# Patient Record
Sex: Female | Born: 1980 | Race: White | Hispanic: No | Smoking: Current every day smoker
Health system: Southern US, Community
[De-identification: ages and names within clinical notes are randomized; demographics above are authoritative.]

## PROBLEM LIST (undated history)

## (undated) DIAGNOSIS — E109 Type 1 diabetes mellitus without complications: Secondary | ICD-10-CM

## (undated) DIAGNOSIS — R112 Nausea with vomiting, unspecified: Secondary | ICD-10-CM

## (undated) DIAGNOSIS — F1910 Other psychoactive substance abuse, uncomplicated: Secondary | ICD-10-CM

## (undated) DIAGNOSIS — R569 Unspecified convulsions: Secondary | ICD-10-CM

## (undated) DIAGNOSIS — J45909 Unspecified asthma, uncomplicated: Secondary | ICD-10-CM

## (undated) DIAGNOSIS — G8929 Other chronic pain: Secondary | ICD-10-CM

## (undated) DIAGNOSIS — K589 Irritable bowel syndrome without diarrhea: Secondary | ICD-10-CM

## (undated) DIAGNOSIS — F319 Bipolar disorder, unspecified: Secondary | ICD-10-CM

## (undated) DIAGNOSIS — R109 Unspecified abdominal pain: Secondary | ICD-10-CM

## (undated) DIAGNOSIS — K219 Gastro-esophageal reflux disease without esophagitis: Secondary | ICD-10-CM

## (undated) HISTORY — PX: KNEE SURGERY: SHX244

## (undated) HISTORY — DX: Type 1 diabetes mellitus without complications: E10.9

## (undated) HISTORY — PX: TUBAL LIGATION: SHX77

## (undated) HISTORY — PX: WRIST SURGERY: SHX841

## (undated) HISTORY — PX: APPENDECTOMY: SHX54

---

## 2004-05-03 ENCOUNTER — Encounter: Admission: RE | Admit: 2004-05-03 | Discharge: 2004-05-03 | Payer: Self-pay | Admitting: Internal Medicine

## 2012-08-04 ENCOUNTER — Other Ambulatory Visit (HOSPITAL_COMMUNITY): Payer: Self-pay | Admitting: Internal Medicine

## 2012-08-04 DIAGNOSIS — R109 Unspecified abdominal pain: Secondary | ICD-10-CM

## 2012-08-06 ENCOUNTER — Ambulatory Visit (HOSPITAL_COMMUNITY): Payer: Self-pay

## 2012-08-07 ENCOUNTER — Ambulatory Visit (HOSPITAL_COMMUNITY)
Admission: RE | Admit: 2012-08-07 | Discharge: 2012-08-07 | Disposition: A | Discharge status: Home / Self Care | Payer: Self-pay | Source: Ambulatory Visit | Attending: Internal Medicine | Admitting: Internal Medicine

## 2012-08-07 DIAGNOSIS — R1031 Right lower quadrant pain: Secondary | ICD-10-CM | POA: Insufficient documentation

## 2012-08-07 DIAGNOSIS — K829 Disease of gallbladder, unspecified: Secondary | ICD-10-CM | POA: Insufficient documentation

## 2012-08-07 DIAGNOSIS — R9389 Abnormal findings on diagnostic imaging of other specified body structures: Secondary | ICD-10-CM | POA: Insufficient documentation

## 2012-08-07 DIAGNOSIS — R933 Abnormal findings on diagnostic imaging of other parts of digestive tract: Secondary | ICD-10-CM | POA: Insufficient documentation

## 2012-08-07 DIAGNOSIS — R109 Unspecified abdominal pain: Secondary | ICD-10-CM

## 2012-08-07 DIAGNOSIS — R634 Abnormal weight loss: Secondary | ICD-10-CM | POA: Insufficient documentation

## 2012-08-07 DIAGNOSIS — R197 Diarrhea, unspecified: Secondary | ICD-10-CM | POA: Insufficient documentation

## 2012-08-07 MED ORDER — IOHEXOL 300 MG/ML  SOLN: 100.0000 mL | Freq: Once | INTRAMUSCULAR | Status: AC | PRN

## 2012-08-17 ENCOUNTER — Encounter: Payer: Self-pay | Admitting: Gastroenterology

## 2012-08-17 ENCOUNTER — Ambulatory Visit (INDEPENDENT_AMBULATORY_CARE_PROVIDER_SITE_OTHER): Payer: Self-pay | Admitting: Gastroenterology

## 2012-08-17 VITALS — BP 111/72 | HR 103 | Temp 97.2°F | Ht 64.0 in | Wt 93.8 lb

## 2012-08-17 DIAGNOSIS — R634 Abnormal weight loss: Secondary | ICD-10-CM

## 2012-08-17 DIAGNOSIS — R112 Nausea with vomiting, unspecified: Secondary | ICD-10-CM

## 2012-08-17 DIAGNOSIS — K921 Melena: Secondary | ICD-10-CM

## 2012-08-17 DIAGNOSIS — R109 Unspecified abdominal pain: Secondary | ICD-10-CM

## 2012-08-17 MED ORDER — OMEPRAZOLE 20 MG PO CPDR: 20.0000 mg | DELAYED_RELEASE_CAPSULE | Freq: Every day | ORAL | Status: DC

## 2012-08-17 MED ORDER — PROMETHAZINE HCL 12.5 MG PO TABS: 12.5000 mg | ORAL_TABLET | Freq: Four times a day (QID) | ORAL | Status: DC | PRN

## 2012-08-17 NOTE — Patient Instructions (Addendum)
Start taking Prilosec each day, 30 minutes before breakfast.   I have provided a prescription as well for Phenergan to take every 6-8 hours as needed for nausea.   We are setting you up for an evaluation of your lower and upper GI tract. Further recommendations once this is completed.

## 2012-08-17 NOTE — Progress Notes (Signed)
Primary Care Physician:  Cassell Smiles., MD Primary Gastroenterologist:  Dr. Darrick Penna  Chief Complaint  Patient presents with  . Diarrhea  . Abdominal Pain    weight loss was 115 (4 months ago)  . Nausea    GERD  . Emesis    HPI:   31 year old pleasant female presenting today with several complaints, notes symptoms X 4 months. Reports change in bowel habits with nocturnal diarrhea, 3-6 times at night. +urgency. Lower abdominal pain, intermittent, knife-like, sometimes improved after BM. Mucusy stool, grainy. Pain worse 5-10 minutes after a meal. Notes incidence of brbpr and black, tarry stool. Denies NSAIDs, aspirin powders. Notes upper abdominal pain as well. No abx other than course of Flagyl due to symptoms. No sick contacts, has city water. States stool test done by Dr. Felecia Shelling and negative.  Was 115 several months ago, now 73. Used to deal with constipation. Felt like not digesting food, was vomiting and throwing up undigested food. Feeling bad. Severe indigestion, nausea. Appetite down, forcing self to eat. Notes abdominal pain lower abdomen,Periods stopped since May. Has seen gynecologist, believes may be due to wt loss. No prior colonoscopy, EGD. Unsure father's family hx. Unsure hx of IBD.   CT abd/pelvis Sept 2013:   Findings: 4 ml simple cyst in the anterior segment right lobe of  liver at the dome, and anatomic variant in that the left lobe of  the liver extends well across the midline into the right upper  quadrant; no significant abnormalities involving the liver. Normal  appearing spleen, pancreas, adrenal glands, and kidneys.  Gallbladder contracted, with enhancement of the gallbladder mucosa;  no pericholecystic inflammation and no calcified gallstones. No  biliary ductal dilation. No visible aorto-iliofemoral  atherosclerosis. Widely patent visceral arteries, with replaced  common hepatic artery to the aorta. No significant  lymphadenopathy.  Stomach normal in  appearance, filled with food, accounting for the  contracted gallbladder. No hiatal hernia. Wall thickening  involving a long segment of the duodenum and jejunum in the left  upper quadrant, with thickening of the valvulae conniventes.  Inspissated stool-like material in the distal and terminal ileum.  Intervening small bowel normal in appearance. Large stool burden  throughout normal appearing colon. Surgical clip adjacent to the  distal descending colon in the left paracolic gutter. Bilateral  tubal ligation clips in the pelvis.  Small to moderate amount of ascites. Bilateral ovarian cyst.  Bilateral ovarian varicoceles. Heterogeneous enhancement of the  uterus, with mild endometrial thickening and/or small amount of  endometrial fluid. Urinary bladder decompressed and unremarkable.  Bone window images unremarkable apart from Schmorl's nodes  involving the lower thoracic vertebrae. Visualized lung bases  clear. Heart size normal.  IMPRESSION:  1. Wall thickening involving a long segment of duodenum and  jejunum in the left upper quadrant, consistent with a localized  enteritis. Statistically, this is infectious, but may be  inflammatory as well. Is there a gluten allergy?  2. Inspissated stool-like material in the distal and terminal  ileum, consistent with stasis. Large stool burden throughout the  normal-appearing colon.  3. Bilateral ovarian varicoceles. While typically an incidental  finding, this can be associated with pelvic congestion syndrome, if  the patient has unexplained pelvic pain.  4. Small to moderate amount of free fluid in the pelvis. Are  there clinical symptoms that might correspond to a recent ovarian  cyst rupture?  5. Contracted gallbladder with mucosal enhancement, felt to be  secondary to the fact that the patient had  recently eaten, as there  are no other signs of acute cholecystitis.      Past Medical History  Diagnosis Date  . Diabetes mellitus  type 1     Past Surgical History  Procedure Date  . Appendectomy   . Knee surgery     left  . Tubal ligation   . Wrist surgery     left    Current Outpatient Prescriptions  Medication Sig Dispense Refill  . HYDROcodone-acetaminophen (VICODIN) 5-500 MG per tablet Take 1 tablet by mouth every 6 (six) hours as needed.      . insulin aspart (NOVOLOG) 100 UNIT/ML injection Inject into the skin 3 (three) times daily before meals. We she eats      . insulin lispro protamine-insulin lispro (HUMALOG 75/25) (75-25) 100 UNIT/ML SUSP Inject 15 Units into the skin 2 (two) times daily with a meal.        Allergies as of 08/17/2012 - Review Complete 08/17/2012  Allergen Reaction Noted  . Sulfa antibiotics Hives 08/17/2012    Family History  Problem Relation Age of Onset  . Colon cancer Neg Hx     History   Social History  . Marital Status: Married    Spouse Name: N/A    Number of Children: 2  . Years of Education: N/A   Occupational History  . stay at home mom    Social History Main Topics  . Smoking status: Current Every Day Smoker -- 1.5 packs/day    Types: Cigarettes  . Smokeless tobacco: Not on file  . Alcohol Use: No  . Drug Use: No  . Sexually Active: Not on file   Other Topics Concern  . Not on file   Social History Narrative  . No narrative on file    Review of Systems: Gen: see HPI CV: Denies chest pain, heart palpitations, peripheral edema, syncope.  Resp: smokers cough  GI: Denies dysphagia or odynophagia. Denies jaundice, hematemesis, fecal incontinence. GU : Denies urinary burning, urinary frequency, urinary hesitancy MS: Denies joint pain, muscle weakness, cramps, or limitation of movement.  Derm: Denies rash, itching Psych: +depression Heme: Denies bruising, bleeding, and enlarged lymph nodes.  Physical Exam: BP 111/72  Pulse 103  Temp 97.2 F (36.2 C) (Temporal)  Ht 5\' 4"  (1.626 m)  Wt 93 lb 12.8 oz (42.547 kg)  BMI 16.10 kg/m2  LMP  03/17/2012 General:   Alert and oriented. Pleasant and cooperative. Thin.  Head:  Normocephalic and atraumatic. Eyes:  Without icterus, sclera clear and conjunctiva pink.  Ears:  Normal auditory acuity. Nose:  No deformity, discharge,  or lesions. Mouth:  No deformity or lesions, oral mucosa pink.  Neck:  Supple, without mass or thyromegaly. Lungs:  Clear to auscultation bilaterally. No wheezes, rales, or rhonchi. No distress.  Heart:  S1, S2 present without murmurs appreciated.  Abdomen:  +BS, soft, mild TTP lower abdomen, right sided abdomen and non-distended. No HSM noted. No guarding or rebound. No masses appreciated.  Rectal:  Deferred  Msk:  Symmetrical without gross deformities. Normal posture. Extremities:  Without clubbing or edema. Neurologic:  Alert and  oriented x4;  grossly normal neurologically. Skin:  Intact without significant lesions or rashes. Cervical Nodes:  No significant cervical adenopathy. Psych:  Alert and cooperative. Normal mood and affect.

## 2012-08-21 ENCOUNTER — Encounter (HOSPITAL_COMMUNITY): Payer: Self-pay | Admitting: Pharmacy Technician

## 2012-08-23 DIAGNOSIS — K921 Melena: Secondary | ICD-10-CM | POA: Insufficient documentation

## 2012-08-23 DIAGNOSIS — R112 Nausea with vomiting, unspecified: Secondary | ICD-10-CM | POA: Insufficient documentation

## 2012-08-23 DIAGNOSIS — R634 Abnormal weight loss: Secondary | ICD-10-CM | POA: Insufficient documentation

## 2012-08-23 DIAGNOSIS — R109 Unspecified abdominal pain: Secondary | ICD-10-CM | POA: Insufficient documentation

## 2012-08-23 NOTE — Assessment & Plan Note (Signed)
Secondary to N/V, decreased po intake, diarrhea.

## 2012-08-23 NOTE — Assessment & Plan Note (Signed)
TCS as planned.  

## 2012-08-23 NOTE — Assessment & Plan Note (Addendum)
31 year old female with 4 month hx of lower abdominal pain, N/V, change in bowel habits from previously dealing with constipation to now nocturnal diarrhea, intermittent hematochezia and possible melena. Notes upper abdominal pain intermittently, vague, but main location lower abdomen. No periods since May; has seen GYN who believes it is secondary to wt loss. (hx tubal ligation). No NSAIDs, aspirin powders. No abx other than Flagyl, reportedly negative stool studies through PCP. This has been requested. Wt loss of close to 25 lbs since onset of symptoms. CT in September with localized enteritis (question of gluten allergy?), large stool burden throughout normal colon, bilateral ovarian varicoceles, small to moderate amount of free fluid in pelvis. Labs from PCP requested. Due to chronicity of symptoms, wt loss, rectal bleeding, will proceed with upper and lower GI evaluation and biopsies as indicated. The risks, benefits, alternatives were discussed in detail. Proceed with colonoscopy and upper EGD with Dr. Darrick Penna in near future.    Addendum 10/14: Received blood work and stool studies. Negative stool culture. +moderate yeast. No anemia on CBC. No leukocytosis. Normal LFTs. TSH normal. Cdiff PCR not performed. Pt empirically treated with course of Flagyl per PCP without improvement; doubt dealing with Cdiff.

## 2012-08-23 NOTE — Assessment & Plan Note (Signed)
Associated with abdominal pain, severe reflux. Pt is Type 1 diabetic. Proceeding with EGD at time of TCS (see abdominal pain with myriad of symptoms). Query uncontrolled GERD, underlying diabetic gastroparesis? Prilosec and Phenergan provided.

## 2012-08-24 ENCOUNTER — Encounter (HOSPITAL_COMMUNITY): Payer: Self-pay | Admitting: *Deleted

## 2012-08-24 ENCOUNTER — Ambulatory Visit (HOSPITAL_COMMUNITY)
Admission: RE | Admit: 2012-08-24 | Discharge: 2012-08-24 | Disposition: A | Discharge status: Home / Self Care | Payer: Self-pay | Source: Ambulatory Visit | Attending: Gastroenterology | Admitting: Gastroenterology

## 2012-08-24 ENCOUNTER — Ambulatory Visit: Payer: Self-pay | Admitting: Gastroenterology

## 2012-08-24 ENCOUNTER — Encounter (HOSPITAL_COMMUNITY)
Admission: RE | Disposition: A | Discharge status: Home / Self Care | Payer: Self-pay | Source: Ambulatory Visit | Attending: Gastroenterology

## 2012-08-24 DIAGNOSIS — K2990 Gastroduodenitis, unspecified, without bleeding: Secondary | ICD-10-CM

## 2012-08-24 DIAGNOSIS — R197 Diarrhea, unspecified: Secondary | ICD-10-CM | POA: Insufficient documentation

## 2012-08-24 DIAGNOSIS — K648 Other hemorrhoids: Secondary | ICD-10-CM | POA: Insufficient documentation

## 2012-08-24 DIAGNOSIS — K2970 Gastritis, unspecified, without bleeding: Secondary | ICD-10-CM

## 2012-08-24 DIAGNOSIS — K921 Melena: Secondary | ICD-10-CM

## 2012-08-24 DIAGNOSIS — R112 Nausea with vomiting, unspecified: Secondary | ICD-10-CM | POA: Insufficient documentation

## 2012-08-24 DIAGNOSIS — K2940 Chronic atrophic gastritis without bleeding: Secondary | ICD-10-CM | POA: Insufficient documentation

## 2012-08-24 DIAGNOSIS — R109 Unspecified abdominal pain: Secondary | ICD-10-CM

## 2012-08-24 DIAGNOSIS — Z01812 Encounter for preprocedural laboratory examination: Secondary | ICD-10-CM | POA: Insufficient documentation

## 2012-08-24 DIAGNOSIS — Z794 Long term (current) use of insulin: Secondary | ICD-10-CM | POA: Insufficient documentation

## 2012-08-24 DIAGNOSIS — E119 Type 2 diabetes mellitus without complications: Secondary | ICD-10-CM | POA: Insufficient documentation

## 2012-08-24 HISTORY — PX: COLONOSCOPY: SHX174

## 2012-08-24 HISTORY — PX: ESOPHAGOGASTRODUODENOSCOPY: SHX1529

## 2012-08-24 SURGERY — COLONOSCOPY WITH ESOPHAGOGASTRODUODENOSCOPY (EGD)
Anesthesia: Moderate Sedation

## 2012-08-24 MED ORDER — BUTAMBEN-TETRACAINE-BENZOCAINE 2-2-14 % EX AERO
INHALATION_SPRAY | CUTANEOUS | Status: DC | PRN
  Administered 2012-08-24: 2 via TOPICAL

## 2012-08-24 MED ORDER — SODIUM CHLORIDE 0.9 % IJ SOLN
INTRAMUSCULAR | Status: AC
  Filled 2012-08-24: qty 10

## 2012-08-24 MED ORDER — MIDAZOLAM HCL 5 MG/5ML IJ SOLN
INTRAMUSCULAR | Status: AC
  Filled 2012-08-24: qty 10

## 2012-08-24 MED ORDER — PROMETHAZINE HCL 25 MG/ML IJ SOLN
INTRAMUSCULAR | Status: AC
  Filled 2012-08-24: qty 1

## 2012-08-24 MED ORDER — INSULIN ASPART 100 UNIT/ML ~~LOC~~ SOLN
4.0000 [IU] | Freq: Once | SUBCUTANEOUS | Status: AC
  Administered 2012-08-24: 4 [IU] via SUBCUTANEOUS
  Filled 2012-08-24: qty 3

## 2012-08-24 MED ORDER — MIDAZOLAM HCL 5 MG/5ML IJ SOLN
INTRAMUSCULAR | Status: DC | PRN
  Administered 2012-08-24 (×4): 2 mg via INTRAVENOUS
  Administered 2012-08-24: 1 mg via INTRAVENOUS

## 2012-08-24 MED ORDER — MEPERIDINE HCL 100 MG/ML IJ SOLN
INTRAMUSCULAR | Status: AC
  Filled 2012-08-24: qty 2

## 2012-08-24 MED ORDER — SODIUM CHLORIDE 0.45 % IV SOLN
INTRAVENOUS | Status: DC
  Administered 2012-08-24: 1000 mL via INTRAVENOUS

## 2012-08-24 MED ORDER — STERILE WATER FOR IRRIGATION IR SOLN
Status: DC | PRN
  Administered 2012-08-24: 15:00:00

## 2012-08-24 MED ORDER — MEPERIDINE HCL 100 MG/ML IJ SOLN
INTRAMUSCULAR | Status: DC | PRN
  Administered 2012-08-24: 50 mg via INTRAVENOUS
  Administered 2012-08-24 (×2): 25 mg via INTRAVENOUS
  Administered 2012-08-24: 50 mg via INTRAVENOUS
  Administered 2012-08-24: 25 mg via INTRAVENOUS

## 2012-08-24 MED ORDER — PROMETHAZINE HCL 25 MG/ML IJ SOLN
INTRAMUSCULAR | Status: DC | PRN
  Administered 2012-08-24 (×2): 12.5 mg via INTRAVENOUS

## 2012-08-24 NOTE — H&P (Signed)
  Primary Care Physician:  Cassell Smiles., MD Primary Gastroenterologist:  Dr. Darrick Penna  Pre-Procedure History & Physical: HPI:  Kristin [name] is a 31 y.o. female here for NAUSEA/VOMITING,/ABD PAIN/DIARRHEA.   Past Medical History  Diagnosis Date  . Diabetes mellitus type 1     Past Surgical History  Procedure Date  . Appendectomy   . Knee surgery     left  . Tubal ligation   . Wrist surgery     left    Prior to Admission medications   Medication Sig Start Date End Date Taking? Authorizing Provider  HYDROcodone-acetaminophen (VICODIN) 5-500 MG per tablet Take 1 tablet by mouth every 6 (six) hours as needed. Pain   Yes Historical Provider, MD  insulin aspart (NOVOLOG) 100 UNIT/ML injection Inject 20-35 Units into the skin 3 (three) times daily before meals. We she eats   Yes Historical Provider, MD  insulin lispro protamine-insulin lispro (HUMALOG 75/25) (75-25) 100 UNIT/ML SUSP Inject 15 Units into the skin 2 (two) times daily with a meal.   Yes Historical Provider, MD  omeprazole (PRILOSEC) 20 MG capsule Take 1 capsule (20 mg total) by mouth daily. Take 30 minutes before breakfast daily. 08/17/12  Yes Nira Retort, NP  promethazine (PHENERGAN) 12.5 MG tablet Take 1 tablet (12.5 mg total) by mouth every 6 (six) hours as needed for nausea. 08/17/12  Yes Nira Retort, NP    Allergies as of 08/17/2012 - Review Complete 08/17/2012  Allergen Reaction Noted  . Sulfa antibiotics Hives 08/17/2012    Family History  Problem Relation Age of Onset  . Colon cancer Neg Hx     History   Social History  . Marital Status: Legally Separated    Spouse Name: N/A    Number of Children: 2  . Years of Education: N/A   Occupational History  . stay at home mom    Social History Main Topics  . Smoking status: Current Every Day Smoker -- 1.5 packs/day    Types: Cigarettes  . Smokeless tobacco: Not on file  . Alcohol Use: No  . Drug Use: No  . Sexually Active: Not on file   Other  Topics Concern  . Not on file   Social History Narrative  . No narrative on file    Review of Systems: See HPI, otherwise negative ROS   Physical Exam: BP 135/87  Pulse 103  Temp 98.6 F (37 C) (Oral)  Resp 22  Ht 5\' 4"  (1.626 m)  Wt 92 lb (41.731 kg)  BMI 15.79 kg/m2  SpO2 96%  LMP 03/17/2012 General:   Alert,  pleasant and cooperative in NAD Head:  Normocephalic and atraumatic. Neck:  Supple; Lungs:  Clear throughout to auscultation.    Heart:  Regular rate and rhythm. Abdomen:  Soft, nontender and nondistended. Normal bowel sounds, without guarding, and without rebound.   Neurologic:  Alert and  oriented x4;  grossly normal neurologically.  Impression/Plan:     NAUSEA/VOMTIING/DIARRHEA/abd pain-CT SHOWS STOOL IN COLON AND FECALIZATION OF SMALL BOWEL.  PLAN: EGD/TCS TODAY

## 2012-08-24 NOTE — Progress Notes (Signed)
Faxed to PCP

## 2012-08-24 NOTE — Op Note (Signed)
[facility] [address] [place], [address]   COLONOSCOPY PROCEDURE REPORT  PATIENT: Kristin [name], Kristin [name]  MR#: [id number] BIRTHDATE: 10/09/1981 , 30  yrs. old GENDER: Female ENDOSCOPIST: Jonette Eva, MD REFERRED XB:[id number] Sherwood Gambler, M.D. PROCEDURE DATE:  08/24/2012 PROCEDURE:   Colonoscopy with biopsy INDICATIONS:abd pain, nausea, vomiting diarrhea w/ incontinence-CT SEP 2013 FORMED STOOL IN COLON, SIGNIFICANT PSYCHOSOCIAL STRESSORS.  MEDICATIONS: Demerol 150 mg IV, Versed 8 mg IV, and Promethazine (Phenergan) 12.5mg  IV  DESCRIPTION OF PROCEDURE:    Physical exam was performed.  Informed consent was obtained from the patient after explaining the benefits, risks, and alternatives to procedure.  The patient was connected to monitor and placed in left lateral position. Continuous oxygen was provided by nasal cannula and IV medicine administered through an indwelling cannula.  After administration of sedation and rectal exam, the patients rectum was intubated and the EC-[id number] ([id number])  colonoscope was advanced under direct visualization to the ileum.  The scope was removed slowly by carefully examining the color, texture, anatomy, and integrity mucosa on the way out.  The patient was recovered in endoscopy and discharged home in satisfactory condition.       COLON FINDINGS: The mucosa appeared normal in the terminal ileum.  , The colonic mucosa appeared normal.  Multiple biopsies were performed.  , and Small internal hemorrhoids were found.  PREP QUALITY: excellent. CECAL W/D TIME: 10 minutes  COMPLICATIONS: None  ENDOSCOPIC IMPRESSION: 1.   Normal mucosa in the terminal ileum 2.   The colonic mucosa appeared normal; multiple biopsies were performed 3.   Small internal hemorrhoids   RECOMMENDATIONS: DRINK WATER TO KEEP URINE LIGHT YELLOW.  FOLLOW A HIGH FIBER DIET.  AVOID ITEMS THAT CAUSE BLOATING.  MIRALAX DAILY.  IF YOU DO NOT HAVE A BM AFTER TWO  DAYS, INCREASE MIRALAX TO TWICE DAILY.  BIOPSY WILL BE BACK IN 7 DAYS.  NEXT ENDOSCOPY WITH PROPOFOL.  OPV IN 3 MOS       _______________________________ eSignedJonette Eva, MD 08/24/2012 4:22 PM     PATIENT NAME:  Kristin [name], Kristin [name] MR#: [id number]

## 2012-08-24 NOTE — Op Note (Addendum)
[facility] [address] [place], [address]   ENDOSCOPY PROCEDURE REPORT  PATIENT: [name], [name]  MR#: [id number] BIRTHDATE: 01/25/1981 , 30  yrs. old GENDER: Female  ENDOSCOPIST: Jonette Eva, MD REFERRED AO:ZHYQM Sherwood Gambler, M.D.  PROCEDURE DATE: 08/24/2012 PROCEDURE:   EGD w/ biopsy  INDICATIONS:lower abd pain, nause, vomiting diarrhea w/ incontinence.  SIGNIFICANT PSYCHOSOCIAL STRESSORS.  CT SEP 2013 SHOWS FORMED STOOL IN COLON. MEDICATIONS: TCS + Demerol 25 mg IV and Versed 1mg  IV TOPICAL ANESTHETIC:   Cetacaine Spray  DESCRIPTION OF PROCEDURE:     Physical exam was performed.  Informed consent was obtained from the patient after explaining the benefits, risks, and alternatives to the procedure.  The patient was connected to the monitor and placed in the left lateral position.  Continuous oxygen was provided by nasal cannula and IV medicine administered through an indwelling cannula.  After administration of sedation, the patients esophagus was intubated and the [id number] ([id number])  endoscope was advanced under direct visualization to the second portion of the duodenum.  The scope was removed slowly by carefully examining the color, texture, anatomy, and integrity of the mucosa on the way out.  The patient was recovered in endoscopy and discharged home in satisfactory condition.      ESOPHAGUS: The mucosa of the esophagus appeared normal.  STOMACH: Mild non-erosive gastritis (inflammation) was found. Multiple biopsies were performed using cold forceps.  DUODENUM: The duodenal mucosa showed no abnormalities in the 2nd part of the duodenum.  Cold forcep biopsies were taken in the second portion. COMPLICATIONS:   None  ENDOSCOPIC IMPRESSION: 1.   The mucosa of the esophagus appeared normal 2.   Non-erosive gastritis (inflammation) was found; multiple biopsies 3.   The duodenal mucosa showed no abnormalities in the 2nd part of the  duodenum  RECOMMENDATIONS: CONTINUE OMEPRAZOLE EVERY MORNING.  AVOID ITEMS THAT TRIGGER GASTRITIS.  DRINK WATER TO KEEP URINE LIGHT YELLOW.  FOLLOW A HIGH FIBER.  AVOID ITEMS THAT CAUSE BLOATING.  MIRALAX DAILY.  IF YOU DO NOT HAVE A BM AFTER TWO DAYS, INCREASE MIRALAX TO TWICE DAILY.  BIOPSY WILL BE BACK IN 7 DAYS.  NEXT ENDOSCOPY WITH PROPOFOL  OPV IN 3 MOS   REPEAT EXAM:   _______________________________ Rosalie DoctorJonette Eva, MD 08/24/2012 4:27 PM Revised: 08/24/2012 4:27 PM      PATIENT NAME:  [name], [name] MR#: [id number]

## 2012-08-27 ENCOUNTER — Telehealth: Payer: Self-pay | Admitting: Gastroenterology

## 2012-08-27 NOTE — Telephone Encounter (Signed)
Path faxed to PCP, recall made 

## 2012-08-27 NOTE — Telephone Encounter (Signed)
Pt aware of results. I am going to mail out a high fiber diet to her.  Dawn can please NIC for 3 months follow up

## 2012-08-27 NOTE — Telephone Encounter (Signed)
Please call pt. Her colon and small bowel biopsies are normal. HER stomach Bx shows mild gastritis. YOUR SYMPTOMS ARE DUE TO IBS/GASTRITIS   CONTINUE OMEPRAZOLE EVERY MORNING.  AVOID ITEMS THAT TRIGGER GASTRITIS.   DRINK WATER TO KEEP URINE LIGHT YELLOW.  FOLLOW A HIGH FIBER DIET. AVOID ITEMS THAT CAUSE BLOATING.   MIRALAX DAILY. IF YOU DO NOT HAVE A BM AFTER TWO DAYS, INCREASE MIRALAX TO TWICE DAILY.  YOUR NEXT ENDOSCOPY NEEDS TO BE WITH PROPOFOL.  FOLLOW UP IN 3 MOS E 30 AS, DX: ABD PAIN.CONSTIPATION. CONSIDER ADDING AMITIZA.

## 2012-09-19 NOTE — Progress Notes (Signed)
REVIEWED.   TCS OCT 2013 NL COLON BX Results EGD OCT 2013 GASTRITIS

## 2012-09-29 ENCOUNTER — Other Ambulatory Visit (HOSPITAL_COMMUNITY): Payer: Self-pay | Admitting: Internal Medicine

## 2012-09-29 DIAGNOSIS — R110 Nausea: Secondary | ICD-10-CM

## 2012-09-29 DIAGNOSIS — R109 Unspecified abdominal pain: Secondary | ICD-10-CM

## 2012-10-02 ENCOUNTER — Encounter (HOSPITAL_COMMUNITY)
Admission: RE | Admit: 2012-10-02 | Discharge: 2012-10-02 | Disposition: A | Discharge status: Home / Self Care | Payer: Self-pay | Source: Ambulatory Visit | Attending: Internal Medicine | Admitting: Internal Medicine

## 2012-10-02 ENCOUNTER — Encounter (HOSPITAL_COMMUNITY): Payer: Self-pay

## 2012-10-02 DIAGNOSIS — R110 Nausea: Secondary | ICD-10-CM | POA: Insufficient documentation

## 2012-10-02 DIAGNOSIS — R109 Unspecified abdominal pain: Secondary | ICD-10-CM | POA: Insufficient documentation

## 2012-10-02 HISTORY — DX: Unspecified asthma, uncomplicated: J45.909

## 2012-10-02 MED ORDER — TECHNETIUM TC 99M SULFUR COLLOID
2.0000 | Freq: Once | INTRAVENOUS | Status: AC | PRN
  Administered 2012-10-02: 1.9 via ORAL

## 2012-11-23 ENCOUNTER — Encounter: Payer: Self-pay | Admitting: Gastroenterology

## 2012-11-24 ENCOUNTER — Ambulatory Visit: Payer: Self-pay | Admitting: Urgent Care

## 2012-11-24 ENCOUNTER — Telehealth: Payer: Self-pay | Admitting: Urgent Care

## 2012-11-24 NOTE — Telephone Encounter (Signed)
Pt was a no show

## 2014-04-02 ENCOUNTER — Encounter (HOSPITAL_COMMUNITY): Payer: Self-pay | Admitting: Emergency Medicine

## 2014-04-02 ENCOUNTER — Emergency Department (HOSPITAL_COMMUNITY): Payer: Self-pay

## 2014-04-02 ENCOUNTER — Inpatient Hospital Stay (HOSPITAL_COMMUNITY)
Admission: EM | Admit: 2014-04-02 | Discharge: 2014-04-05 | DRG: 392 | Disposition: A | Discharge status: Home / Self Care | Payer: Self-pay | Attending: Internal Medicine | Admitting: Internal Medicine

## 2014-04-02 DIAGNOSIS — K5289 Other specified noninfective gastroenteritis and colitis: Principal | ICD-10-CM

## 2014-04-02 DIAGNOSIS — R112 Nausea with vomiting, unspecified: Secondary | ICD-10-CM

## 2014-04-02 DIAGNOSIS — K921 Melena: Secondary | ICD-10-CM

## 2014-04-02 DIAGNOSIS — R1115 Cyclical vomiting syndrome unrelated to migraine: Secondary | ICD-10-CM

## 2014-04-02 DIAGNOSIS — E1043 Type 1 diabetes mellitus with diabetic autonomic (poly)neuropathy: Secondary | ICD-10-CM

## 2014-04-02 DIAGNOSIS — R109 Unspecified abdominal pain: Secondary | ICD-10-CM | POA: Diagnosis present

## 2014-04-02 DIAGNOSIS — Z23 Encounter for immunization: Secondary | ICD-10-CM | POA: Diagnosis not present

## 2014-04-02 DIAGNOSIS — R634 Abnormal weight loss: Secondary | ICD-10-CM

## 2014-04-02 DIAGNOSIS — E109 Type 1 diabetes mellitus without complications: Secondary | ICD-10-CM

## 2014-04-02 DIAGNOSIS — Z794 Long term (current) use of insulin: Secondary | ICD-10-CM

## 2014-04-02 DIAGNOSIS — F17200 Nicotine dependence, unspecified, uncomplicated: Secondary | ICD-10-CM | POA: Diagnosis present

## 2014-04-02 DIAGNOSIS — Z720 Tobacco use: Secondary | ICD-10-CM | POA: Diagnosis present

## 2014-04-02 DIAGNOSIS — J45909 Unspecified asthma, uncomplicated: Secondary | ICD-10-CM | POA: Diagnosis present

## 2014-04-02 DIAGNOSIS — R1110 Vomiting, unspecified: Secondary | ICD-10-CM | POA: Diagnosis present

## 2014-04-02 DIAGNOSIS — K589 Irritable bowel syndrome without diarrhea: Secondary | ICD-10-CM | POA: Diagnosis present

## 2014-04-02 DIAGNOSIS — K529 Noninfective gastroenteritis and colitis, unspecified: Secondary | ICD-10-CM | POA: Diagnosis present

## 2014-04-02 DIAGNOSIS — D72829 Elevated white blood cell count, unspecified: Secondary | ICD-10-CM | POA: Diagnosis present

## 2014-04-02 DIAGNOSIS — F411 Generalized anxiety disorder: Secondary | ICD-10-CM | POA: Diagnosis present

## 2014-04-02 DIAGNOSIS — E119 Type 2 diabetes mellitus without complications: Secondary | ICD-10-CM

## 2014-04-02 HISTORY — DX: Irritable bowel syndrome, unspecified: K58.9

## 2014-04-02 LAB — URINALYSIS, ROUTINE W REFLEX MICROSCOPIC
BILIRUBIN URINE: NEGATIVE
GLUCOSE, UA: NEGATIVE mg/dL
HGB URINE DIPSTICK: NEGATIVE
Leukocytes, UA: NEGATIVE
Nitrite: NEGATIVE
PROTEIN: NEGATIVE mg/dL
Specific Gravity, Urine: 1.02 (ref 1.005–1.030)
Urobilinogen, UA: 0.2 mg/dL (ref 0.0–1.0)
pH: 8.5 — ABNORMAL HIGH (ref 5.0–8.0)

## 2014-04-02 LAB — BASIC METABOLIC PANEL
BUN: 11 mg/dL (ref 6–23)
CALCIUM: 9.5 mg/dL (ref 8.4–10.5)
CO2: 26 meq/L (ref 19–32)
CREATININE: 0.54 mg/dL (ref 0.50–1.10)
Chloride: 102 mEq/L (ref 96–112)
GFR calc Af Amer: >90 mL/min (ref >90)
GFR calc non Af Amer: >90 mL/min (ref >90)
Glucose, Bld: 79 mg/dL (ref 70–99)
Potassium: 3.9 mEq/L (ref 3.7–5.3)
Sodium: 139 mEq/L (ref 137–147)

## 2014-04-02 LAB — GLUCOSE, CAPILLARY
GLUCOSE-CAPILLARY: 318 mg/dL — AB (ref 70–99)
Glucose-Capillary: 228 mg/dL — ABNORMAL HIGH (ref 70–99)

## 2014-04-02 LAB — CBC WITH DIFFERENTIAL/PLATELET
BASOS ABS: 0 10*3/uL (ref 0.0–0.1)
BASOS PCT: 0 % (ref 0–1)
EOS ABS: 0.1 10*3/uL (ref 0.0–0.7)
EOS PCT: 1 % (ref 0–5)
HEMATOCRIT: 40.3 % (ref 36.0–46.0)
Hemoglobin: 13.4 g/dL (ref 12.0–15.0)
LYMPHS PCT: 16 % (ref 12–46)
Lymphs Abs: 2.7 10*3/uL (ref 0.7–4.0)
MCH: 29.2 pg (ref 26.0–34.0)
MCHC: 33.3 g/dL (ref 30.0–36.0)
MCV: 87.8 fL (ref 78.0–100.0)
MONO ABS: 0.6 10*3/uL (ref 0.1–1.0)
Monocytes Relative: 3 % (ref 3–12)
Neutro Abs: 14 10*3/uL — ABNORMAL HIGH (ref 1.7–7.7)
Neutrophils Relative %: 80 % — ABNORMAL HIGH (ref 43–77)
Platelets: 361 10*3/uL (ref 150–400)
RBC: 4.59 MIL/uL (ref 3.87–5.11)
RDW: 14.8 % (ref 11.5–15.5)
WBC: 17.5 10*3/uL — ABNORMAL HIGH (ref 4.0–10.5)

## 2014-04-02 LAB — HEPATIC FUNCTION PANEL
ALT: 8 U/L (ref 0–35)
AST: 15 U/L (ref 0–37)
Albumin: 3.8 g/dL (ref 3.5–5.2)
Alkaline Phosphatase: 74 U/L (ref 39–117)
Total Bilirubin: 0.6 mg/dL (ref 0.3–1.2)
Total Protein: 7.3 g/dL (ref 6.0–8.3)

## 2014-04-02 LAB — POC URINE PREG, ED: PREG TEST UR: NEGATIVE

## 2014-04-02 LAB — RAPID URINE DRUG SCREEN, HOSP PERFORMED
Amphetamines: NOT DETECTED
BARBITURATES: NOT DETECTED
Benzodiazepines: NOT DETECTED
Cocaine: NOT DETECTED
OPIATES: NOT DETECTED
TETRAHYDROCANNABINOL: POSITIVE — AB

## 2014-04-02 LAB — LIPASE, BLOOD: Lipase: 11 U/L (ref 11–59)

## 2014-04-02 LAB — LACTIC ACID, PLASMA: LACTIC ACID, VENOUS: 1.4 mmol/L (ref 0.5–2.2)

## 2014-04-02 LAB — CBG MONITORING, ED: Glucose-Capillary: 78 mg/dL (ref 70–99)

## 2014-04-02 MED ORDER — PROMETHAZINE HCL 25 MG/ML IJ SOLN
INTRAMUSCULAR | Status: AC
  Administered 2014-04-02: 25 mg via INTRAMUSCULAR
  Filled 2014-04-02: qty 1

## 2014-04-02 MED ORDER — METRONIDAZOLE IN NACL 5-0.79 MG/ML-% IV SOLN
500.0000 mg | Freq: Three times a day (TID) | INTRAVENOUS | Status: DC
  Administered 2014-04-02 – 2014-04-05 (×8): 500 mg via INTRAVENOUS
  Filled 2014-04-02 (×8): qty 100

## 2014-04-02 MED ORDER — FAMOTIDINE IN NACL 20-0.9 MG/50ML-% IV SOLN
INTRAVENOUS | Status: AC
  Filled 2014-04-02: qty 50

## 2014-04-02 MED ORDER — FENTANYL CITRATE 0.05 MG/ML IJ SOLN
50.0000 ug | Freq: Once | INTRAMUSCULAR | Status: AC
  Administered 2014-04-02: 50 ug via INTRAVENOUS

## 2014-04-02 MED ORDER — SODIUM CHLORIDE 0.9 % IV SOLN
INTRAVENOUS | Status: DC
  Administered 2014-04-02 – 2014-04-05 (×4): via INTRAVENOUS

## 2014-04-02 MED ORDER — LORAZEPAM 2 MG/ML IJ SOLN
1.0000 mg | Freq: Once | INTRAMUSCULAR | Status: AC
  Administered 2014-04-02: 1 mg via INTRAVENOUS
  Filled 2014-04-02: qty 1

## 2014-04-02 MED ORDER — ONDANSETRON HCL 4 MG/2ML IJ SOLN
8.0000 mg | Freq: Once | INTRAMUSCULAR | Status: AC
  Administered 2014-04-02: 8 mg via INTRAVENOUS
  Filled 2014-04-02: qty 4

## 2014-04-02 MED ORDER — ACETAMINOPHEN 325 MG PO TABS: 650.0000 mg | ORAL_TABLET | Freq: Four times a day (QID) | ORAL | Status: DC | PRN

## 2014-04-02 MED ORDER — LORAZEPAM 2 MG/ML IJ SOLN
1.0000 mg | Freq: Once | INTRAMUSCULAR | Status: AC
  Administered 2014-04-02: 1 mg via INTRAVENOUS

## 2014-04-02 MED ORDER — IOHEXOL 300 MG/ML  SOLN
100.0000 mL | Freq: Once | INTRAMUSCULAR | Status: AC | PRN
  Administered 2014-04-02: 100 mL via INTRAVENOUS

## 2014-04-02 MED ORDER — ONDANSETRON HCL 4 MG/2ML IJ SOLN: 8.0000 mg | Freq: Once | INTRAMUSCULAR | Status: DC

## 2014-04-02 MED ORDER — PANTOPRAZOLE SODIUM 40 MG IV SOLR
40.0000 mg | Freq: Once | INTRAVENOUS | Status: AC
  Administered 2014-04-02: 40 mg via INTRAVENOUS
  Filled 2014-04-02: qty 40

## 2014-04-02 MED ORDER — INSULIN ASPART 100 UNIT/ML ~~LOC~~ SOLN
0.0000 [IU] | SUBCUTANEOUS | Status: DC
  Administered 2014-04-02: 7 [IU] via SUBCUTANEOUS
  Administered 2014-04-02: 5 [IU] via SUBCUTANEOUS
  Administered 2014-04-03: 3 [IU] via SUBCUTANEOUS
  Administered 2014-04-03: 5 [IU] via SUBCUTANEOUS
  Administered 2014-04-03: 2 [IU] via SUBCUTANEOUS
  Administered 2014-04-03: 5 [IU] via SUBCUTANEOUS
  Administered 2014-04-03 – 2014-04-04 (×4): 3 [IU] via SUBCUTANEOUS
  Administered 2014-04-04: 5 [IU] via SUBCUTANEOUS
  Administered 2014-04-05: 2 [IU] via SUBCUTANEOUS
  Administered 2014-04-05 (×2): 5 [IU] via SUBCUTANEOUS
  Administered 2014-04-05: 2 [IU] via SUBCUTANEOUS

## 2014-04-02 MED ORDER — CIPROFLOXACIN IN D5W 400 MG/200ML IV SOLN
400.0000 mg | Freq: Two times a day (BID) | INTRAVENOUS | Status: DC
  Administered 2014-04-03 – 2014-04-05 (×5): 400 mg via INTRAVENOUS
  Filled 2014-04-02 (×5): qty 200

## 2014-04-02 MED ORDER — ONDANSETRON HCL 4 MG/2ML IJ SOLN
INTRAMUSCULAR | Status: AC
  Filled 2014-04-02: qty 2

## 2014-04-02 MED ORDER — ENOXAPARIN SODIUM 40 MG/0.4ML ~~LOC~~ SOLN
40.0000 mg | SUBCUTANEOUS | Status: DC
  Administered 2014-04-02 – 2014-04-04 (×3): 40 mg via SUBCUTANEOUS
  Filled 2014-04-02 (×3): qty 0.4

## 2014-04-02 MED ORDER — FENTANYL CITRATE 0.05 MG/ML IJ SOLN
INTRAMUSCULAR | Status: AC
  Filled 2014-04-02: qty 2

## 2014-04-02 MED ORDER — PROMETHAZINE HCL 25 MG/ML IJ SOLN
25.0000 mg | Freq: Once | INTRAMUSCULAR | Status: AC
Start: 2014-04-02 — End: 2014-04-02
  Administered 2014-04-02: 25 mg via INTRAMUSCULAR

## 2014-04-02 MED ORDER — PROMETHAZINE HCL 25 MG/ML IJ SOLN
25.0000 mg | Freq: Four times a day (QID) | INTRAMUSCULAR | Status: DC | PRN
  Administered 2014-04-02 – 2014-04-04 (×6): 25 mg via INTRAVENOUS
  Filled 2014-04-02 (×6): qty 1

## 2014-04-02 MED ORDER — FENTANYL CITRATE 0.05 MG/ML IJ SOLN
100.0000 ug | Freq: Once | INTRAMUSCULAR | Status: AC
  Administered 2014-04-02: 100 ug via INTRAVENOUS

## 2014-04-02 MED ORDER — FENTANYL CITRATE 0.05 MG/ML IJ SOLN
50.0000 ug | Freq: Once | INTRAMUSCULAR | Status: AC
  Administered 2014-04-02: 50 ug via INTRAVENOUS
  Filled 2014-04-02: qty 2

## 2014-04-02 MED ORDER — PROMETHAZINE HCL 12.5 MG PO TABS: 12.5000 mg | ORAL_TABLET | Freq: Four times a day (QID) | ORAL | Status: DC | PRN

## 2014-04-02 MED ORDER — PROMETHAZINE HCL 25 MG/ML IJ SOLN
25.0000 mg | Freq: Once | INTRAMUSCULAR | Status: AC
  Administered 2014-04-02: 25 mg via INTRAMUSCULAR

## 2014-04-02 MED ORDER — LORAZEPAM 2 MG/ML IJ SOLN
INTRAMUSCULAR | Status: AC
  Administered 2014-04-02: 1 mg via INTRAVENOUS
  Filled 2014-04-02: qty 1

## 2014-04-02 MED ORDER — QUETIAPINE FUMARATE ER 300 MG PO TB24
300.0000 mg | ORAL_TABLET | Freq: Every day | ORAL | Status: DC
  Administered 2014-04-02 – 2014-04-04 (×3): 300 mg via ORAL
  Filled 2014-04-02 (×4): qty 1

## 2014-04-02 MED ORDER — LORAZEPAM 2 MG/ML IJ SOLN
1.0000 mg | Freq: Three times a day (TID) | INTRAMUSCULAR | Status: DC | PRN
  Administered 2014-04-02 – 2014-04-05 (×6): 1 mg via INTRAVENOUS
  Filled 2014-04-02 (×6): qty 1

## 2014-04-02 MED ORDER — SODIUM CHLORIDE 0.9 % IV SOLN
INTRAVENOUS | Status: DC
Start: 2014-04-02 — End: 2014-04-05
  Administered 2014-04-02: 19:00:00 via INTRAVENOUS

## 2014-04-02 MED ORDER — SODIUM CHLORIDE 0.9 % IV SOLN
1000.0000 mL | Freq: Once | INTRAVENOUS | Status: AC
  Administered 2014-04-02: 1000 mL via INTRAVENOUS

## 2014-04-02 MED ORDER — CIPROFLOXACIN IN D5W 400 MG/200ML IV SOLN
400.0000 mg | Freq: Once | INTRAVENOUS | Status: AC
  Administered 2014-04-02: 400 mg via INTRAVENOUS
  Filled 2014-04-02: qty 200

## 2014-04-02 MED ORDER — SODIUM CHLORIDE 0.9 % IV SOLN
1000.0000 mL | INTRAVENOUS | Status: DC
  Administered 2014-04-02: 1000 mL via INTRAVENOUS

## 2014-04-02 MED ORDER — ACETAMINOPHEN 650 MG RE SUPP: 650.0000 mg | Freq: Four times a day (QID) | RECTAL | Status: DC | PRN

## 2014-04-02 MED ORDER — METOCLOPRAMIDE HCL 5 MG/ML IJ SOLN
10.0000 mg | Freq: Once | INTRAMUSCULAR | Status: AC
  Administered 2014-04-02: 10 mg via INTRAVENOUS
  Filled 2014-04-02: qty 2

## 2014-04-02 MED ORDER — FAMOTIDINE IN NACL 20-0.9 MG/50ML-% IV SOLN
20.0000 mg | Freq: Two times a day (BID) | INTRAVENOUS | Status: DC
  Administered 2014-04-02 – 2014-04-04 (×5): 20 mg via INTRAVENOUS
  Filled 2014-04-02 (×6): qty 50

## 2014-04-02 MED ORDER — DIPHENHYDRAMINE HCL 50 MG/ML IJ SOLN
25.0000 mg | Freq: Once | INTRAMUSCULAR | Status: AC
  Administered 2014-04-02: 25 mg via INTRAVENOUS
  Filled 2014-04-02: qty 1

## 2014-04-02 MED ORDER — FENTANYL CITRATE 0.05 MG/ML IJ SOLN
INTRAMUSCULAR | Status: AC
  Administered 2014-04-02: 100 ug via INTRAVENOUS
  Filled 2014-04-02: qty 2

## 2014-04-02 MED ORDER — ONDANSETRON HCL 4 MG/2ML IJ SOLN
4.0000 mg | Freq: Once | INTRAMUSCULAR | Status: AC
  Administered 2014-04-02: 4 mg via INTRAVENOUS

## 2014-04-02 MED ORDER — HYDROMORPHONE HCL PF 1 MG/ML IJ SOLN
2.0000 mg | INTRAMUSCULAR | Status: DC | PRN
  Administered 2014-04-02 – 2014-04-05 (×17): 2 mg via INTRAVENOUS
  Filled 2014-04-02 (×18): qty 2

## 2014-04-02 MED ORDER — ONDANSETRON HCL 4 MG/2ML IJ SOLN
4.0000 mg | Freq: Once | INTRAMUSCULAR | Status: DC
  Filled 2014-04-02: qty 2

## 2014-04-02 MED ORDER — IOHEXOL 300 MG/ML  SOLN
50.0000 mL | Freq: Once | INTRAMUSCULAR | Status: AC | PRN
  Administered 2014-04-02: 50 mL via ORAL

## 2014-04-02 MED ORDER — METRONIDAZOLE IN NACL 5-0.79 MG/ML-% IV SOLN
500.0000 mg | Freq: Once | INTRAVENOUS | Status: AC
  Administered 2014-04-02: 500 mg via INTRAVENOUS
  Filled 2014-04-02: qty 100

## 2014-04-02 MED ORDER — ONDANSETRON HCL 4 MG/2ML IJ SOLN
4.0000 mg | Freq: Once | INTRAMUSCULAR | Status: AC
  Administered 2014-04-02: 4 mg via INTRAVENOUS
  Filled 2014-04-02: qty 2

## 2014-04-02 NOTE — ED Notes (Addendum)
Pt not tolerating po contrast well. Pt vomited x1. EDP aware and give verbal order to admin pt 25mg  of phenergan IM.

## 2014-04-02 NOTE — ED Provider Notes (Signed)
CSN: [id number]     Arrival date & time 04/02/14  1215 History  This chart was scribed for Ward Givens, MD by Bronson Curb, ED Scribe. This patient was seen in room APA02/APA02 and the patient's care was started at 1:09 PM.      Chief Complaint  Patient presents with  . Abdominal Pain   LEVEL 5 CAVEAT for DISTRESS  The history is provided by the patient and a parent. No language interpreter was used.   HPI Comments: Kristin [name] is a 33 y.o. female who presents to the Emergency Department complaining of constant upper abdominal pain onset 3-4 days ago. Per mother, patient has been waking up sick in the morning for the past 3-4 days with nausea and vomiting. Each morning has gotten progressively worse and taking longer to resolve. She has had vomiting today with dry heaves at least 10 times. Today has been the worst. There is associated pain upper abdomen that she is unable to characterize. There is sob. Dx with DM 10 years ago. Does not have a meter anymore so she doesn't check her CBG's.  No similar episodes. Has been admitted to hospital for DKA in the past.    PCP: Sherwood Gambler  Past Medical History  Diagnosis Date  . Diabetes mellitus type 1   . Asthma   . IBS (irritable bowel syndrome)    Past Surgical History  Procedure Laterality Date  . Appendectomy    . Knee surgery      left  . Tubal ligation    . Wrist surgery      left  . Colonoscopy  08/24/2012    SLF: Normal mucosa in the terminal ileum/The colonic mucosa appeared normal; multiple biopsies were performed/ Small internal hemorrhoids  . Esophagogastroduodenoscopy  08/24/2012    SLF: The mucosa of the esophagus appeared normal/Non-erosive gastritis (inflammation) was found; multiple bx/The duodenal mucosa showed no abnormalities in the 2nd part of the duodenum   Family History  Problem Relation Age of Onset  . Colon cancer Neg Hx    History  Substance Use Topics  . Smoking status: Current Every Day Smoker --  1.50 packs/day    Types: Cigarettes  . Smokeless tobacco: Not on file  . Alcohol Use: No   Lives with her mother for the past week since breaking up with her boyfriend.  unemployed  OB History   Grav Para Term Preterm Abortions TAB SAB Ect Mult Living                 Review of Systems  Respiratory: Positive for shortness of breath.   Gastrointestinal: Positive for nausea and vomiting.  All other systems reviewed and are negative.     Allergies  Sulfa antibiotics  Home Medications   Prior to Admission medications   Medication Sig Start Date End Date Taking? Authorizing Provider  HYDROcodone-acetaminophen (VICODIN) 5-500 MG per tablet Take 1 tablet by mouth every 6 (six) hours as needed. Pain    Historical Provider, MD  insulin aspart (NOVOLOG) 100 UNIT/ML injection Inject 20-35 Units into the skin 3 (three) times daily before meals. We she eats    Historical Provider, MD  insulin lispro protamine-insulin lispro (HUMALOG 75/25) (75-25) 100 UNIT/ML SUSP Inject 15 Units into the skin 2 (two) times daily with a meal.    Historical Provider, MD  omeprazole (PRILOSEC) 20 MG capsule Take 1 capsule (20 mg total) by mouth daily. Take 30 minutes before breakfast daily. 08/17/12   Tobi Bastos  Janann Colonel, NP  promethazine (PHENERGAN) 12.5 MG tablet Take 1 tablet (12.5 mg total) by mouth every 6 (six) hours as needed for nausea. 08/17/12   Nira Retort, NP   Triage Vitals: BP 119/73  Pulse 77  Temp(Src) 98.3 F (36.8 C) (Oral)  Resp 20  Ht 5\' 2"  (1.575 m)  Wt 120 lb (54.432 kg)  BMI 21.94 kg/m2  SpO2 100%  LMP 03/05/2014  Vital signs normal    Physical Exam  Nursing note and vitals reviewed. Constitutional: She is oriented to person, place, and time. She appears well-developed and well-nourished.  Non-toxic appearance. She does not appear ill. She appears distressed.  Moaning and yelling. Hard to lay still.  HENT:  Head: Normocephalic and atraumatic.  Right Ear: External ear normal.  Left  Ear: External ear normal.  Nose: Nose normal. No mucosal edema or rhinorrhea.  Mouth/Throat: Oropharynx is clear and moist and mucous membranes are normal. No dental abscesses or uvula swelling.  Tongue is dry.  Eyes: Conjunctivae and EOM are normal. Pupils are equal, round, and reactive to light.  Neck: Normal range of motion and full passive range of motion without pain. Neck supple.  Cardiovascular: Normal rate, regular rhythm and normal heart sounds.  Exam reveals no gallop and no friction rub.   No murmur heard. Pulmonary/Chest: Effort normal and breath sounds normal. No respiratory distress. She has no wheezes. She has no rhonchi. She has no rales. She exhibits no tenderness and no crepitus.  Abdominal: Soft. Normal appearance and bowel sounds are normal. She exhibits no distension. There is no tenderness. There is no rebound and no guarding.  No localizing pain.  Musculoskeletal: Normal range of motion. She exhibits no edema and no tenderness.  Moves all extremities well.   Neurological: She is alert and oriented to person, place, and time. She has normal strength. No cranial nerve deficit.  Skin: Skin is warm, dry and intact. No rash noted. No erythema. No pallor.  Psychiatric: She has a normal mood and affect. Her speech is normal and behavior is normal. Her mood appears not anxious.    ED Course  Procedures (including critical care time)  Medications  0.9 %  sodium chloride infusion (0 mLs Intravenous Stopped 04/02/14 1412)    Followed by  0.9 %  sodium chloride infusion (0 mLs Intravenous Stopped 04/02/14 1538)    Followed by  0.9 %  sodium chloride infusion (1,000 mLs Intravenous New Bag/Given 04/02/14 1538)  ondansetron (ZOFRAN) 4 MG/2ML injection (not administered)  metroNIDAZOLE (FLAGYL) IVPB 500 mg (not administered)  ciprofloxacin (CIPRO) IVPB 400 mg (400 mg Intravenous New Bag/Given 04/02/14 1758)  ondansetron (ZOFRAN) injection 4 mg (4 mg Intravenous Given 04/02/14 1239)   fentaNYL (SUBLIMAZE) injection 50 mcg (50 mcg Intravenous Given 04/02/14 1253)  fentaNYL (SUBLIMAZE) injection 50 mcg (50 mcg Intravenous Given 04/02/14 1321)  metoCLOPramide (REGLAN) injection 10 mg (10 mg Intravenous Given 04/02/14 1321)  diphenhydrAMINE (BENADRYL) injection 25 mg (25 mg Intravenous Given 04/02/14 1320)  LORazepam (ATIVAN) injection 1 mg (1 mg Intravenous Given 04/02/14 1322)  ondansetron (ZOFRAN) injection 4 mg (4 mg Intravenous Given 04/02/14 1321)  pantoprazole (PROTONIX) injection 40 mg (40 mg Intravenous Given 04/02/14 1414)  ondansetron (ZOFRAN) injection 4 mg (4 mg Intravenous Given 04/02/14 1437)  fentaNYL (SUBLIMAZE) injection 50 mcg (50 mcg Intravenous Given 04/02/14 1438)  iohexol (OMNIPAQUE) 300 MG/ML solution 50 mL (50 mLs Oral Contrast Given 04/02/14 1456)  promethazine (PHENERGAN) injection 25 mg (25 mg Intramuscular Given 04/02/14  1533)  LORazepam (ATIVAN) injection 1 mg (1 mg Intravenous Given 04/02/14 1629)  fentaNYL (SUBLIMAZE) injection 100 mcg (100 mcg Intravenous Given 04/02/14 1629)  promethazine (PHENERGAN) injection 25 mg (25 mg Intramuscular Given 04/02/14 1632)  iohexol (OMNIPAQUE) 300 MG/ML solution 100 mL (100 mLs Intravenous Contrast Given 04/02/14 1641)  ondansetron (ZOFRAN) injection 8 mg (8 mg Intravenous Given 04/02/14 1759)    Review of prior records shows in November 2013 she had a normal gastric emptying study done. In September 2013 she had a CT of her abdomen pelvis. It showed localized enteritis with wall thickening of the duodenum and jejunum. She had a large stool burden and bilateral ovarian varicoceles.   2:50 PM- Discussed test results. Patient is looking better and is able to talk. Discussed need for CT scan. Pt advised of plan for treatment and pt agrees.  PT continues to need a lot of nausea and pain medications. She had difficulty drinking her oral contrast.  After review of her CT scan she was started on IV cipro and flagyl for possible  colitis.    Pt given her CT results, we discussed admission for control of her nausea and vomiting and pain.   17:57 Dr Gonzella Lexhungel admit to obs, med-surg, team 1  Labs Review Results for orders placed during the hospital encounter of 04/02/14  URINALYSIS, ROUTINE W REFLEX MICROSCOPIC      Result Value Ref Range   Color, Urine YELLOW  YELLOW   APPearance CLEAR  CLEAR   Specific Gravity, Urine 1.020  1.005 - 1.030   pH 8.5 (*) 5.0 - 8.0   Glucose, UA NEGATIVE  NEGATIVE mg/dL   Hgb urine dipstick NEGATIVE  NEGATIVE   Bilirubin Urine NEGATIVE  NEGATIVE   Ketones, ur TRACE (*) NEGATIVE mg/dL   Protein, ur NEGATIVE  NEGATIVE mg/dL   Urobilinogen, UA 0.2  0.0 - 1.0 mg/dL   Nitrite NEGATIVE  NEGATIVE   Leukocytes, UA NEGATIVE  NEGATIVE  CBC WITH DIFFERENTIAL      Result Value Ref Range   WBC 17.5 (*) 4.0 - 10.5 K/uL   RBC 4.59  3.87 - 5.11 MIL/uL   Hemoglobin 13.4  12.0 - 15.0 g/dL   HCT 40.940.3  81.136.0 - 91.446.0 %   MCV 87.8  78.0 - 100.0 fL   MCH 29.2  26.0 - 34.0 pg   MCHC 33.3  30.0 - 36.0 g/dL   RDW 78.214.8  95.611.5 - 21.315.5 %   Platelets 361  150 - 400 K/uL   Neutrophils Relative % 80 (*) 43 - 77 %   Neutro Abs 14.0 (*) 1.7 - 7.7 K/uL   Lymphocytes Relative 16  12 - 46 %   Lymphs Abs 2.7  0.7 - 4.0 K/uL   Monocytes Relative 3  3 - 12 %   Monocytes Absolute 0.6  0.1 - 1.0 K/uL   Eosinophils Relative 1  0 - 5 %   Eosinophils Absolute 0.1  0.0 - 0.7 K/uL   Basophils Relative 0  0 - 1 %   Basophils Absolute 0.0  0.0 - 0.1 K/uL  BASIC METABOLIC PANEL      Result Value Ref Range   Sodium 139  137 - 147 mEq/L   Potassium 3.9  3.7 - 5.3 mEq/L   Chloride 102  96 - 112 mEq/L   CO2 26  19 - 32 mEq/L   Glucose, Bld 79  70 - 99 mg/dL   BUN 11  6 - 23 mg/dL  Creatinine, Ser 0.54  0.50 - 1.10 mg/dL   Calcium 9.5  8.4 - 16.1 mg/dL   GFR calc non Af Amer >90  >90 mL/min   GFR calc Af Amer >90  >90 mL/min  HEPATIC FUNCTION PANEL      Result Value Ref Range   Total Protein 7.3  6.0 - 8.3 g/dL    Albumin 3.8  3.5 - 5.2 g/dL   AST 15  0 - 37 U/L   ALT 8  0 - 35 U/L   Alkaline Phosphatase 74  39 - 117 U/L   Total Bilirubin 0.6  0.3 - 1.2 mg/dL   Bilirubin, Direct <0.9  0.0 - 0.3 mg/dL   Indirect Bilirubin NOT CALCULATED  0.3 - 0.9 mg/dL  LIPASE, BLOOD      Result Value Ref Range   Lipase 11  11 - 59 U/L  CBG MONITORING, ED      Result Value Ref Range   Glucose-Capillary 78  70 - 99 mg/dL  POC URINE PREG, ED      Result Value Ref Range   Preg Test, Ur NEGATIVE  NEGATIVE   Laboratory interpretation all normal except leukocytosis      Imaging Review Ct Abdomen Pelvis W Contrast  04/02/2014   CLINICAL DATA:  Upper abdominal pain.  Nausea and vomiting.  EXAM: CT ABDOMEN AND PELVIS WITH CONTRAST  TECHNIQUE: Multidetector CT imaging of the abdomen and pelvis was performed using the standard protocol following bolus administration of intravenous contrast.  CONTRAST:  50mL OMNIPAQUE IOHEXOL 300 MG/ML SOLN, OMNIPAQUE IOHEXOL 300 MG/ML SOLN  COMPARISON:  CT abdomen pelvis 08/07/2012.  FINDINGS: Visualization of the lower thorax demonstrates no consolidative or nodular pulmonary opacities. No pleural effusion. Normal heart size.  Liver is normal in contour. Focal fatty deposition adjacent to the falciform ligament. Gallbladder is unremarkable. Spleen, pancreas and bilateral adrenal glands are unremarkable. Kidneys enhance symmetrically with contrast. No hydronephrosis.  Normal caliber abdominal aorta. Urinary bladder is unremarkable. The uterus and adnexal structures are unremarkable. Bilateral tubal ligation clips.  Colon is relatively decompressed. There is suggestion of wall thickening of transverse and descending colon. Small amount of free fluid within the pelvis. No evidence for bowel obstruction. No free intraperitoneal air.  No aggressive or acute appearing osseous lesions.  IMPRESSION: Descending and sigmoid colon is relatively decompressed limiting evaluation however there is  suggestion of wall thickening which may represent colitis in the appropriate clinical setting. Small amount of free fluid in the pelvis.   Electronically Signed   By: Annia Belt M.D.   On: 04/02/2014 17:05     EKG Interpretation None         MDM   Final diagnoses:  Intractable vomiting  Abdominal pain  Diabetes  Colitis    Plan admission   Devoria Albe, MD, FACEP    I personally performed the services described in this documentation, which was scribed in my presence. The recorded information has been reviewed and considered.      Ward Givens, MD 04/02/14 1807

## 2014-04-02 NOTE — ED Notes (Signed)
Pt not able to drink PO contrast. Pt still c/o of pain and nausea. Pt vomited x1. EDP aware and give verbal order to admin 1 mg of ativan IV,183mcg of fentanyl IV,and 25 mg of phenergan IM.

## 2014-04-02 NOTE — ED Notes (Addendum)
Pt reports abdominal pain,n/v for several days. Pt mother reports that pt has been unable to regulate sugar at home. Pt reports took 5 units of insulin this am. Pt CBG at time of arrival in ED 78.

## 2014-04-02 NOTE — H&P (Signed)
[facility] History and Physical  Kristin MaltaJessica M [name] ZOX:[id number] DOB: 03/07/1981 DOA: 04/02/2014  Referring physician: Dr Devoria AlbeIva Knapp PCP: Kristin SmilesFUSCO,LAWRENCE J., MD   Chief Complaint:  Nausea and vomiting for 2 days  HPI:  with past medical hx of  Type 1 DM, IBS, substance abuse ( active tobacco use, cocaine use until few years back)  presented with nausea and vomiting for 2 days. patient reports some nausea for past 4 days . She has 2 episodes of vomiting yesterday but since this morning she has been having intractable nausea and vomiting with epigastric and LLQ pain. patient was admitted in 2013 for abdominal pain with findings of enteritis with unremarkable EGD and colonoscopy. Patient reports eating some salad outside about 4 days back. Denies any sick contact or recent travel. Patient denies headache, dizziness, fever, chills,  chest pain, palpitations, SOB,  bowel or urinary symptoms. Denies change in weight or appetite.  Course in the EEG patient was afebrile. Vitals otherwise unremarkable. The odontoid leukocytosis with WBC of 17.5 K. hemoglobin, hematocrit and platelets normal. Chemistry normal. Lipase and LFTs were normal. UA was unremarkable. CT scan of the abdomen and pelvis with contrast showed wall thickening of the transverse and descending colon possible for colitis. Patient given several doses of IV fentanyl, IV Reglan and started on IV fluids. Patient also given a dose of IV ciprofloxacin and Flagyl.  hospitalist consulted for admission under observation.   Review of Systems:  Constitutional: Denies fever, chills, diaphoresis, appetite change and fatigue.  HEENT: Denies  eye pain,, ear pain, congestion, sore throat, rhinorrhea, sneezing, mouth sores, trouble swallowing,  Respiratory: Denies SOB, DOE, cough, chest tightness,  and wheezing.   Cardiovascular: Denies chest pain, palpitations and leg swelling.  Gastrointestinal:  nausea, vomiting, abdominal pain, Denies  diarrhea, constipation, blood in stool and abdominal distention.  Genitourinary: Denies dysuria, urgency, frequency, hematuria, flank pain and difficulty urinating.  Endocrine: Denies: hot or cold intolerance,  polyuria, polydipsia. Musculoskeletal: Denies myalgias, back pain, joint swelling, arthralgias and gait problem.  Skin: Denies pallor, rash and wound.  Neurological: Denies dizziness, seizures, syncope, weakness, light-headedness, numbness and headaches.     Past Medical History  Diagnosis Date  . Diabetes mellitus type 1   . Asthma   . IBS (irritable bowel syndrome)    Past Surgical History  Procedure Laterality Date  . Appendectomy    . Knee surgery      left  . Tubal ligation    . Wrist surgery      left  . Colonoscopy  08/24/2012    SLF: Normal mucosa in the terminal ileum/The colonic mucosa appeared normal; multiple biopsies were performed/ Small internal hemorrhoids  . Esophagogastroduodenoscopy  08/24/2012    SLF: The mucosa of the esophagus appeared normal/Non-erosive gastritis (inflammation) was found; multiple bx/The duodenal mucosa showed no abnormalities in the 2nd part of the duodenum   Social History:  reports that she has been smoking Cigarettes.  She has been smoking about 1.50 packs per day. She does not have any smokeless tobacco history on file. She reports that she does not drink alcohol or use illicit drugs.  Allergies  Allergen Reactions  . Sulfa Antibiotics Hives    Family History  Problem Relation Age of Onset  . Colon cancer Neg Hx     Prior to Admission medications   Medication Sig Start Date End Date Taking? Authorizing Provider  HYDROcodone-acetaminophen (VICODIN) 5-500 MG per tablet Take 1 tablet by mouth every 6 (six) hours as  needed. Pain   Yes Historical Provider, MD  insulin aspart (NOVOLOG) 100 UNIT/ML injection Inject into the skin 3 (three) times daily before meals. We she eats   Yes Historical Provider, MD  insulin lispro  protamine-insulin lispro (HUMALOG 75/25) (75-25) 100 UNIT/ML SUSP Inject 15 Units into the skin 2 (two) times daily with a meal.   Yes Historical Provider, MD  QUEtiapine (SEROQUEL XR) 300 MG 24 hr tablet Take 300 mg by mouth at bedtime.   Yes Historical Provider, MD     Physical Exam:  Filed Vitals:   04/02/14 1500 04/02/14 1628 04/02/14 1630 04/02/14 1800  BP: 134/83 126/73 130/99 150/83  Pulse: 79 83 85 72  Temp:  98.5 F (36.9 C)    TempSrc:      Resp: 19 18    Height:      Weight:      SpO2: 100% 100% 100% 100%    Constitutional: Vital signs reviewed.  Patient is a middle  aged female in some distress. HEENT: no pallor, no icterus, dry oral mucosa,  Cardiovascular: RRR, S1 normal, S2 normal, no MRG Chest: CTAB, no wheezes, rales, or rhonchi Abdominal: Soft. Epigastric and LLQ tenderness,  non-distended, bowel sounds are normal, no masses, organomegaly, or guarding present.  GU: no CVA tenderness Ext: warm, no edema Neurological: A&O x3, non focal  Labs on Admission:  Basic Metabolic Panel:  Recent Labs Lab 04/02/14 1237  NA 139  K 3.9  CL 102  CO2 26  GLUCOSE 79  BUN 11  CREATININE 0.54  CALCIUM 9.5   Liver Function Tests:  Recent Labs Lab 04/02/14 1235  AST 15  ALT 8  ALKPHOS 74  BILITOT 0.6  PROT 7.3  ALBUMIN 3.8    Recent Labs Lab 04/02/14 1235  LIPASE 11   No results found for this basename: AMMONIA,  in the last 168 hours CBC:  Recent Labs Lab 04/02/14 1237  WBC 17.5*  NEUTROABS 14.0*  HGB 13.4  HCT 40.3  MCV 87.8  PLT 361   Cardiac Enzymes: No results found for this basename: CKTOTAL, CKMB, CKMBINDEX, TROPONINI,  in the last 168 hours BNP: No components found with this basename: POCBNP,  CBG:  Recent Labs Lab 04/02/14 1222  GLUCAP 78    Radiological Exams on Admission: Ct Abdomen Pelvis W Contrast  04/02/2014   CLINICAL DATA:  Upper abdominal pain.  Nausea and vomiting.  EXAM: CT ABDOMEN AND PELVIS WITH CONTRAST   TECHNIQUE: Multidetector CT imaging of the abdomen and pelvis was performed using the standard protocol following bolus administration of intravenous contrast.  CONTRAST:  50mL OMNIPAQUE IOHEXOL 300 MG/ML SOLN, OMNIPAQUE IOHEXOL 300 MG/ML SOLN  COMPARISON:  CT abdomen pelvis 08/07/2012.  FINDINGS: Visualization of the lower thorax demonstrates no consolidative or nodular pulmonary opacities. No pleural effusion. Normal heart size.  Liver is normal in contour. Focal fatty deposition adjacent to the falciform ligament. Gallbladder is unremarkable. Spleen, pancreas and bilateral adrenal glands are unremarkable. Kidneys enhance symmetrically with contrast. No hydronephrosis.  Normal caliber abdominal aorta. Urinary bladder is unremarkable. The uterus and adnexal structures are unremarkable. Bilateral tubal ligation clips.  Colon is relatively decompressed. There is suggestion of wall thickening of transverse and descending colon. Small amount of free fluid within the pelvis. No evidence for bowel obstruction. No free intraperitoneal air.  No aggressive or acute appearing osseous lesions.  IMPRESSION: Descending and sigmoid colon is relatively decompressed limiting evaluation however there is suggestion of wall thickening which may  represent colitis in the appropriate clinical setting. Small amount of free fluid in the pelvis.   Electronically Signed   By: Annia Belt M.D.   On: 04/02/2014 17:05      Assessment/Plan Principal Problem:   Colitis, acute Possibly infectious. Admit on observation to MedSurg. Will hydrate her with IV fluids, supportive care with when necessary Zofran and Phenergan. continue empiric ciprofloxacin and Flagyl. -Chemistry normal. Lipase normal. Check lactic acid. -Monitor CBC and electrolytes in the morning. Patient had a normal EGD and colonoscopy in 2013 when she was admitted for enteritis and was thought to have IBS. -Pain control with when necessary IV Dilaudid. Add bid  pepcid  Active Problems:   Nausea & vomiting secondary to above. Supportive care with antiemetics and IV fluids     Tobacco abuse Counseled on smoking cessation.  ? Substance abuse Patient reports smoking marijuana occasionally. Her mother informed that patient used cocaine until a few years ago and is not sure that she continues to do so. She is also not sure if patient has been doing any IV drugs S. he feels patient is frequently agitated and in a bad mood. Patient has been asking for frequent pain medications while in the ED and has already received 250 mg of IV fentanyl. Her abdominal pain symptom does appear out of proportion to her underlying illness. Will check urine for drug screen.    Diabetes mellitus type I Hold home dose insulin as patient is n.p.o. monitor fsg and place on sliding scale insulin.  Diet: NPO  DVT prophylaxis: sq lovenox   Code Status: full code Family Communication: discussed with mother at bedside Disposition Plan: home once improved  Julitza Rickles [facility] Pager [phone]  Total time spent on admission :50 minutes  If 7PM-7AM, please contact night-coverage [email or web address] Password [facility] 04/02/2014, 6:30 PM

## 2014-04-02 NOTE — ED Notes (Signed)
Pt thriving around in bed and tearful. EDP aware and reported to give pt of fentanyl and complete a poc urine pregnancy and obtain urinalysis.

## 2014-04-02 NOTE — ED Notes (Signed)
Pt lying quietly and resting on left side. Pt mother at bedside. nad noted.

## 2014-04-03 DIAGNOSIS — R109 Unspecified abdominal pain: Secondary | ICD-10-CM

## 2014-04-03 DIAGNOSIS — F17200 Nicotine dependence, unspecified, uncomplicated: Secondary | ICD-10-CM

## 2014-04-03 LAB — CBC
HCT: 34.7 % — ABNORMAL LOW (ref 36.0–46.0)
Hemoglobin: 11.1 g/dL — ABNORMAL LOW (ref 12.0–15.0)
MCH: 28.8 pg (ref 26.0–34.0)
MCHC: 32 g/dL (ref 30.0–36.0)
MCV: 90.1 fL (ref 78.0–100.0)
PLATELETS: 300 10*3/uL (ref 150–400)
RBC: 3.85 MIL/uL — ABNORMAL LOW (ref 3.87–5.11)
RDW: 15 % (ref 11.5–15.5)
WBC: 13.7 10*3/uL — AB (ref 4.0–10.5)

## 2014-04-03 LAB — BASIC METABOLIC PANEL
BUN: 11 mg/dL (ref 6–23)
CHLORIDE: 102 meq/L (ref 96–112)
CO2: 23 mEq/L (ref 19–32)
Calcium: 7.8 mg/dL — ABNORMAL LOW (ref 8.4–10.5)
Creatinine, Ser: 0.55 mg/dL (ref 0.50–1.10)
GFR calc Af Amer: >90 mL/min (ref >90)
GFR calc non Af Amer: >90 mL/min (ref >90)
Glucose, Bld: 329 mg/dL — ABNORMAL HIGH (ref 70–99)
Potassium: 3.9 mEq/L (ref 3.7–5.3)
Sodium: 136 mEq/L — ABNORMAL LOW (ref 137–147)

## 2014-04-03 LAB — GLUCOSE, CAPILLARY
GLUCOSE-CAPILLARY: 279 mg/dL — AB (ref 70–99)
GLUCOSE-CAPILLARY: 206 mg/dL — AB (ref 70–99)
Glucose-Capillary: 298 mg/dL — ABNORMAL HIGH (ref 70–99)
Glucose-Capillary: 197 mg/dL — ABNORMAL HIGH (ref 70–99)
Glucose-Capillary: 237 mg/dL — ABNORMAL HIGH (ref 70–99)

## 2014-04-03 MED ORDER — PNEUMOCOCCAL VAC POLYVALENT 25 MCG/0.5ML IJ INJ
0.5000 mL | INJECTION | INTRAMUSCULAR | Status: AC
  Administered 2014-04-04: 0.5 mL via INTRAMUSCULAR
  Filled 2014-04-03: qty 0.5

## 2014-04-03 MED ORDER — INSULIN GLARGINE 100 UNIT/ML ~~LOC~~ SOLN
5.0000 [IU] | Freq: Every day | SUBCUTANEOUS | Status: DC
  Administered 2014-04-03 – 2014-04-04 (×2): 5 [IU] via SUBCUTANEOUS
  Filled 2014-04-03 (×2): qty 0.05

## 2014-04-03 MED ORDER — PROTAMINE SULFATE 10 MG/ML IV SOLN: 6.0000 mg | Freq: Once | INTRAVENOUS | Status: DC

## 2014-04-03 MED ORDER — NICOTINE 21 MG/24HR TD PT24
21.0000 mg | MEDICATED_PATCH | Freq: Every day | TRANSDERMAL | Status: DC
Start: 2014-04-03 — End: 2014-04-05
  Administered 2014-04-03 – 2014-04-05 (×3): 21 mg via TRANSDERMAL
  Filled 2014-04-03 (×3): qty 1

## 2014-04-03 NOTE — Progress Notes (Signed)
[facility] Progress Note   Keryn Teutsch Thorner MWN:[id number] DOB: 12-Jun-1981 DOA: 04/02/2014 PCP: Cassell Smiles., MD  Brief narrative: Kristin [name] is a 33 y.o. female presenting on 04/02/2014 with  has a past medical history of Diabetes mellitus type 1; Asthma; and IBS (irritable bowel syndrome) who presents with vomiting and is found to have a colitis on CT scan.    Subjective: Pain and nausea are improved after symptomatic medication. Able to tolerate sips of water now.   Assessment/Plan: Principal Problem:   Colitis, acute - cont Cipro and Flagyl via IV  Active Problems:   Nausea & vomiting - controlled with Phenergan    Abdominal pain - controlled with Dilaudid    Tobacco abuse - Nicotine patch    Diabetes mellitus type I - place on low dose insulin until tolerating meals- then increase to home dose   Code Status: full code Family Communication: none Disposition Plan: home  Consultants: none  Procedures: none  Antibiotics: Antibiotics Given (last 72 hours)   Date/Time Action Medication Dose Rate   04/02/14 1905 Given   metroNIDAZOLE (FLAGYL) IVPB 500 mg 500 mg 100 mL/hr   04/02/14 2319 Given   metroNIDAZOLE (FLAGYL) IVPB 500 mg 500 mg 100 mL/hr   04/03/14 0418 Given   ciprofloxacin (CIPRO) IVPB 400 mg 400 mg 200 mL/hr   04/03/14 4034 Given   metroNIDAZOLE (FLAGYL) IVPB 500 mg 500 mg 100 mL/hr       DVT prophylaxis: Add Lovenox  Objective: Filed Weights   04/02/14 1223 04/02/14 2039  Weight: 54.432 kg (120 lb) 55.8 kg (123 lb 0.3 oz)   Filed Vitals:   04/02/14 1858 04/02/14 2039 04/02/14 2045 04/03/14 0440  BP: 164/81  163/83 106/68  Pulse: 69     Temp: 98.2 F (36.8 C)  98 F (36.7 C) 97.6 F (36.4 C)  TempSrc: Oral   Oral  Resp: 20  18 20   Height:  5\' 2"  (1.575 m)    Weight:  55.8 kg (123 lb 0.3 oz)    SpO2: 97%  98% 98%     Intake/Output Summary (Last 24 hours) at 04/03/14 1053 Last data filed at 04/03/14 0800  Gross  per 24 hour  Intake      0 ml  Output   1300 ml  Net  -1300 ml     Exam: General: No acute respiratory distress Lungs: Clear to auscultation bilaterally without wheezes or crackles Cardiovascular: Regular rate and rhythm without murmur gallop or rub normal S1 and S2 Abdomen: tender in LLQ, nondistended, soft, bowel sounds positive, no rebound, no ascites, no appreciable mass Extremities: No significant cyanosis, clubbing, or edema bilateral lower extremities  Data Reviewed: Basic Metabolic Panel:  Recent Labs Lab 04/02/14 1237 04/03/14 0530  NA 139 136*  K 3.9 3.9  CL 102 102  CO2 26 23  GLUCOSE 79 329*  BUN 11 11  CREATININE 0.54 0.55  CALCIUM 9.5 7.8*   Liver Function Tests:  Recent Labs Lab 04/02/14 1235  AST 15  ALT 8  ALKPHOS 74  BILITOT 0.6  PROT 7.3  ALBUMIN 3.8    Recent Labs Lab 04/02/14 1235  LIPASE 11   No results found for this basename: AMMONIA,  in the last 168 hours CBC:  Recent Labs Lab 04/02/14 1237 04/03/14 0530  WBC 17.5* 13.7*  NEUTROABS 14.0*  --   HGB 13.4 11.1*  HCT 40.3 34.7*  MCV 87.8 90.1  PLT 361 300   Cardiac Enzymes: No  results found for this basename: CKTOTAL, CKMB, CKMBINDEX, TROPONINI,  in the last 168 hours BNP (last 3 results) No results found for this basename: PROBNP,  in the last 8760 hours CBG:  Recent Labs Lab 04/02/14 1222 04/02/14 2047 04/02/14 2317 04/03/14 0429 04/03/14 0729  GLUCAP 78 318* 228* 279* 298*    No results found for this or any previous visit (from the past 240 hour(s)).   Studies:  Recent x-ray studies have been reviewed in detail by the Attending Physician  Scheduled Meds:  Scheduled Meds: . ciprofloxacin  400 mg Intravenous Q12H  . enoxaparin (LOVENOX) injection  40 mg Subcutaneous Q24H  . famotidine (PEPCID) IV  20 mg Intravenous Q12H  . insulin aspart  0-9 Units Subcutaneous 6 times per day  . insulin glargine  5 Units Subcutaneous Daily  . metronidazole  500 mg  Intravenous Q8H  . nicotine  21 mg Transdermal Daily  . [START ON 04/04/2014] pneumococcal 23 valent vaccine  0.5 mL Intramuscular Tomorrow-1000  . QUEtiapine  300 mg Oral QHS   Continuous Infusions: . sodium chloride 1,000 mL (04/02/14 1538)  . sodium chloride 125 mL/hr at 04/02/14 1905  . sodium chloride 125 mL/hr at 04/02/14 2042    Time spent on care of this patient: 30 min   Calvert CantorSaima Antuane Eastridge, MD 04/03/2014, 10:53 AM  LOS: 1 day   [facility] Office  [phone] Pager - Text Page per Loretha StaplerAmion   If 7PM-7AM, please contact night-coverage [email or web address]

## 2014-04-04 LAB — CBC
HCT: 33.3 % — ABNORMAL LOW (ref 36.0–46.0)
Hemoglobin: 11 g/dL — ABNORMAL LOW (ref 12.0–15.0)
MCH: 29.6 pg (ref 26.0–34.0)
MCHC: 33 g/dL (ref 30.0–36.0)
MCV: 89.5 fL (ref 78.0–100.0)
Platelets: 271 10*3/uL (ref 150–400)
RBC: 3.72 MIL/uL — ABNORMAL LOW (ref 3.87–5.11)
RDW: 14.7 % (ref 11.5–15.5)
WBC: 9.1 10*3/uL (ref 4.0–10.5)

## 2014-04-04 LAB — GLUCOSE, CAPILLARY
Glucose-Capillary: 103 mg/dL — ABNORMAL HIGH (ref 70–99)
Glucose-Capillary: 115 mg/dL — ABNORMAL HIGH (ref 70–99)
Glucose-Capillary: 227 mg/dL — ABNORMAL HIGH (ref 70–99)
Glucose-Capillary: 242 mg/dL — ABNORMAL HIGH (ref 70–99)
Glucose-Capillary: 243 mg/dL — ABNORMAL HIGH (ref 70–99)
Glucose-Capillary: 256 mg/dL — ABNORMAL HIGH (ref 70–99)
Glucose-Capillary: 83 mg/dL (ref 70–99)

## 2014-04-04 MED ORDER — ALPRAZOLAM 0.25 MG PO TABS
0.2500 mg | ORAL_TABLET | Freq: Three times a day (TID) | ORAL | Status: DC | PRN
  Administered 2014-04-04: 0.25 mg via ORAL
  Filled 2014-04-04 (×2): qty 1

## 2014-04-04 MED ORDER — INSULIN GLARGINE 100 UNIT/ML ~~LOC~~ SOLN
10.0000 [IU] | Freq: Every day | SUBCUTANEOUS | Status: DC
  Administered 2014-04-05: 10 [IU] via SUBCUTANEOUS
  Filled 2014-04-04 (×2): qty 0.1

## 2014-04-04 MED ORDER — PROMETHAZINE HCL 25 MG/ML IJ SOLN
25.0000 mg | Freq: Once | INTRAMUSCULAR | Status: AC
  Administered 2014-04-04: 25 mg via INTRAVENOUS
  Filled 2014-04-04: qty 1

## 2014-04-04 MED ORDER — INSULIN GLARGINE 100 UNIT/ML ~~LOC~~ SOLN
5.0000 [IU] | Freq: Once | SUBCUTANEOUS | Status: AC
  Administered 2014-04-04: 5 [IU] via SUBCUTANEOUS
  Filled 2014-04-04: qty 0.05

## 2014-04-04 NOTE — Progress Notes (Signed)
[facility] Progress Note   Jashanti Burleigh Biswell MAU:[id number] DOB: 04/21/1981 DOA: 04/02/2014 PCP: Cassell Smiles., MD  Brief narrative: Kristin [name] is a 33 y.o. female presenting on 04/02/2014 with  has a past medical history of Diabetes mellitus type 1; Asthma; and IBS (irritable bowel syndrome) who presents with vomiting and is found to have a colitis on CT scan.    Subjective: Still having pain and nausea but tolerable as long as she is getting her medications. Very anxious, crying.   Assessment/Plan: Principal Problem:   Colitis, acute - cont Cipro and Flagyl via IV - cont Clear liquids   Active Problems:   Nausea & vomiting - controlled with Phenergan    Abdominal pain - controlled with Dilaudid    Tobacco abuse - Nicotine patch    Diabetes mellitus type I - placed on low dose insulin - will advance dose  Anxiety - start PRN Xanax   Code Status: full code Family Communication: none Disposition Plan: home  Consultants: none  Procedures: none  Antibiotics: Antibiotics Given (last 72 hours)   Date/Time Action Medication Dose Rate   04/02/14 1905 Given   metroNIDAZOLE (FLAGYL) IVPB 500 mg 500 mg 100 mL/hr   04/02/14 2319 Given   metroNIDAZOLE (FLAGYL) IVPB 500 mg 500 mg 100 mL/hr   04/03/14 0418 Given   ciprofloxacin (CIPRO) IVPB 400 mg 400 mg 200 mL/hr   04/03/14 5638 Given   metroNIDAZOLE (FLAGYL) IVPB 500 mg 500 mg 100 mL/hr   04/03/14 1704 Given   ciprofloxacin (CIPRO) IVPB 400 mg 400 mg 200 mL/hr   04/03/14 2314 Given   metroNIDAZOLE (FLAGYL) IVPB 500 mg 500 mg 100 mL/hr   04/04/14 0516 Given   ciprofloxacin (CIPRO) IVPB 400 mg 400 mg 200 mL/hr   04/04/14 9373 Given   metroNIDAZOLE (FLAGYL) IVPB 500 mg 500 mg 100 mL/hr       DVT prophylaxis: Add Lovenox  Objective: Filed Weights   04/02/14 1223 04/02/14 2039  Weight: 54.432 kg (120 lb) 55.8 kg (123 lb 0.3 oz)   Filed Vitals:   04/03/14 0440 04/03/14 1430 04/03/14 2203  04/04/14 0433  BP: 106/68 128/85 122/81 144/83  Pulse:  94 84 80  Temp: 97.6 F (36.4 C) 98 F (36.7 C) 98.5 F (36.9 C) 98.4 F (36.9 C)  TempSrc: Oral Oral Oral Oral  Resp: 20 20 20 20   Height:      Weight:      SpO2: 98% 98% 100% 100%     Intake/Output Summary (Last 24 hours) at 04/04/14 1021 Last data filed at 04/04/14 0400  Gross per 24 hour  Intake 4112.5 ml  Output   2100 ml  Net 2012.5 ml     Exam: General: No acute respiratory distress Lungs: Clear to auscultation bilaterally without wheezes or crackles Cardiovascular: Regular rate and rhythm without murmur gallop or rub normal S1 and S2 Abdomen: tender in LLQ, nondistended, soft, bowel sounds positive, no rebound, no ascites, no appreciable mass Extremities: No significant cyanosis, clubbing, or edema bilateral lower extremities  Data Reviewed: Basic Metabolic Panel:  Recent Labs Lab 04/02/14 1237 04/03/14 0530  NA 139 136*  K 3.9 3.9  CL 102 102  CO2 26 23  GLUCOSE 79 329*  BUN 11 11  CREATININE 0.54 0.55  CALCIUM 9.5 7.8*   Liver Function Tests:  Recent Labs Lab 04/02/14 1235  AST 15  ALT 8  ALKPHOS 74  BILITOT 0.6  PROT 7.3  ALBUMIN 3.8  Recent Labs Lab 04/02/14 1235  LIPASE 11   No results found for this basename: AMMONIA,  in the last 168 hours CBC:  Recent Labs Lab 04/02/14 1237 04/03/14 0530 04/04/14 0507  WBC 17.5* 13.7* 9.1  NEUTROABS 14.0*  --   --   HGB 13.4 11.1* 11.0*  HCT 40.3 34.7* 33.3*  MCV 87.8 90.1 89.5  PLT 361 300 271   Cardiac Enzymes: No results found for this basename: CKTOTAL, CKMB, CKMBINDEX, TROPONINI,  in the last 168 hours BNP (last 3 results) No results found for this basename: PROBNP,  in the last 8760 hours CBG:  Recent Labs Lab 04/03/14 1646 04/03/14 2020 04/04/14 0026 04/04/14 0432 04/04/14 0803  GLUCAP 237* 206* 83 115* 256*    No results found for this or any previous visit (from the past 240 hour(s)).    Studies:  Recent x-ray studies have been reviewed in detail by the Attending Physician  Scheduled Meds:  Scheduled Meds: . ciprofloxacin  400 mg Intravenous Q12H  . enoxaparin (LOVENOX) injection  40 mg Subcutaneous Q24H  . famotidine (PEPCID) IV  20 mg Intravenous Q12H  . insulin aspart  0-9 Units Subcutaneous 6 times per day  . insulin glargine  5 Units Subcutaneous Daily  . metronidazole  500 mg Intravenous Q8H  . nicotine  21 mg Transdermal Daily  . pneumococcal 23 valent vaccine  0.5 mL Intramuscular Tomorrow-1000  . QUEtiapine  300 mg Oral QHS   Continuous Infusions: . sodium chloride 1,000 mL (04/02/14 1538)  . sodium chloride 125 mL/hr at 04/02/14 1905  . sodium chloride 125 mL/hr at 04/04/14 [id number]    Time spent on care of this patient: 30 min   Calvert CantorSaima Evanne Matsunaga, MD 04/04/2014, 10:21 AM  LOS: 2 days   [facility] Office  [phone] Pager - Text Page per Loretha StaplerAmion   If 7PM-7AM, please contact night-coverage [email or web address]

## 2014-04-05 ENCOUNTER — Telehealth: Payer: Self-pay | Admitting: Gastroenterology

## 2014-04-05 DIAGNOSIS — E1049 Type 1 diabetes mellitus with other diabetic neurological complication: Secondary | ICD-10-CM

## 2014-04-05 DIAGNOSIS — G909 Disorder of the autonomic nervous system, unspecified: Secondary | ICD-10-CM

## 2014-04-05 LAB — GLUCOSE, CAPILLARY
GLUCOSE-CAPILLARY: 296 mg/dL — AB (ref 70–99)
GLUCOSE-CAPILLARY: 257 mg/dL — AB (ref 70–99)
Glucose-Capillary: 191 mg/dL — ABNORMAL HIGH (ref 70–99)
Glucose-Capillary: 185 mg/dL — ABNORMAL HIGH (ref 70–99)

## 2014-04-05 LAB — CBC
HEMATOCRIT: 32 % — AB (ref 36.0–46.0)
HEMOGLOBIN: 10.6 g/dL — AB (ref 12.0–15.0)
MCH: 29.3 pg (ref 26.0–34.0)
MCHC: 33.1 g/dL (ref 30.0–36.0)
MCV: 88.4 fL (ref 78.0–100.0)
Platelets: 266 10*3/uL (ref 150–400)
RBC: 3.62 MIL/uL — ABNORMAL LOW (ref 3.87–5.11)
RDW: 14.3 % (ref 11.5–15.5)
WBC: 7.4 10*3/uL (ref 4.0–10.5)

## 2014-04-05 MED ORDER — OXYCODONE HCL 5 MG PO TABS
5.0000 mg | ORAL_TABLET | ORAL | Status: DC | PRN
Start: 2014-04-05 — End: 2014-05-05

## 2014-04-05 MED ORDER — METRONIDAZOLE 500 MG PO TABS
500.0000 mg | ORAL_TABLET | Freq: Three times a day (TID) | ORAL | Status: DC
Start: 2014-04-05 — End: 2014-05-05

## 2014-04-05 MED ORDER — LORAZEPAM 1 MG PO TABS
1.0000 mg | ORAL_TABLET | Freq: Four times a day (QID) | ORAL | Status: DC | PRN
  Administered 2014-04-05: 1 mg via ORAL
  Filled 2014-04-05: qty 1

## 2014-04-05 MED ORDER — FAMOTIDINE 20 MG PO TABS
20.0000 mg | ORAL_TABLET | Freq: Two times a day (BID) | ORAL | Status: DC
  Administered 2014-04-05: 20 mg via ORAL
  Filled 2014-04-05: qty 1

## 2014-04-05 MED ORDER — CIPROFLOXACIN HCL 250 MG PO TABS
500.0000 mg | ORAL_TABLET | Freq: Two times a day (BID) | ORAL | Status: DC
  Administered 2014-04-05: 500 mg via ORAL
  Filled 2014-04-05: qty 2

## 2014-04-05 MED ORDER — OXYCODONE HCL 5 MG PO TABS
5.0000 mg | ORAL_TABLET | ORAL | Status: DC | PRN
  Administered 2014-04-05: 5 mg via ORAL
  Filled 2014-04-05: qty 1

## 2014-04-05 MED ORDER — CIPROFLOXACIN HCL 500 MG PO TABS: 500.0000 mg | ORAL_TABLET | Freq: Two times a day (BID) | ORAL | Status: DC

## 2014-04-05 MED ORDER — LORAZEPAM 1 MG PO TABS: 1.0000 mg | ORAL_TABLET | Freq: Three times a day (TID) | ORAL | Status: DC | PRN

## 2014-04-05 MED ORDER — METRONIDAZOLE 500 MG PO TABS
500.0000 mg | ORAL_TABLET | Freq: Three times a day (TID) | ORAL | Status: DC
  Administered 2014-04-05 (×2): 500 mg via ORAL
  Filled 2014-04-05 (×2): qty 1

## 2014-04-05 MED ORDER — OXYCODONE HCL ER 30 MG PO T12A: 30.0000 mg | EXTENDED_RELEASE_TABLET | Freq: Two times a day (BID) | ORAL | Status: DC

## 2014-04-05 MED ORDER — OXYCODONE HCL ER 15 MG PO T12A
30.0000 mg | EXTENDED_RELEASE_TABLET | Freq: Two times a day (BID) | ORAL | Status: DC
  Administered 2014-04-05: 30 mg via ORAL
  Filled 2014-04-05: qty 2

## 2014-04-05 MED ORDER — SACCHAROMYCES BOULARDII 250 MG PO CHEW: 1.0000 | CHEWABLE_TABLET | Freq: Every day | ORAL | Status: DC

## 2014-04-05 NOTE — Discharge Planning (Addendum)
Pt stated she was ok with going home.  Per Dr. Request, pt was educated on pain control and what to expect at home.  Pt was told that the pain should decrease some, but unfortunately due to her IBS it would not go away.  Pt's IV removed and pt given scripts and suggestions for FU appointments.  Pt educated as to when to call doctor and when to return to hospital.  Pt walked to car by PCT and family.

## 2014-04-05 NOTE — Progress Notes (Signed)
Inpatient Diabetes Program Recommendations  AACE/ADA: New Consensus Statement on Inpatient Glycemic Control (2013)  Target Ranges:  Prepandial:   less than 140 mg/dL      Peak postprandial:   less than 180 mg/dL (1-2 hours)      Critically ill patients:  140 - 180 mg/dL   Results for Kristin [name], Kristin [name] (MRN [id number]) as of 04/05/2014 07:41  Ref. Range 04/04/2014 08:03 04/04/2014 11:34 04/04/2014 16:39 04/04/2014 21:02 04/04/2014 23:50 04/05/2014 04:37  Glucose-Capillary Latest Range: 70-99 mg/dL 225 (H) 750 (H) 518 (H) 243 (H) 103 (H) 191 (H)   Diabetes history: DM1 Outpatient Diabetes medications: Humalog 75/25 15 units BId, Novolog 3 units TID with meals Current orders for Inpatient glycemic control: Lantus 10 units daily, Novolog 0-9 units Q4H  Inpatient Diabetes Program Recommendations Insulin - Basal: Please consider increasing Lantus to 15 units daily. HgbA1C: Please consider ordering an A1C to evaluate glycemic control over the past 2-3 months.  Thanks, Orlando Penner, RN, MSN, CCRN Diabetes Coordinator Inpatient Diabetes Program [phone] (Team Pager) [phone] ([facility] office) [phone] [facility] office)

## 2014-04-05 NOTE — Discharge Instructions (Signed)
Follow up closely with your PCP. I have also given you a number to get connected with the GI doctor.    Colitis Colitis is inflammation of the colon. Colitis can be a short-term or long-standing (chronic) illness. Crohn's disease and ulcerative colitis are 2 types of colitis which are chronic. They usually require lifelong treatment. CAUSES  There are many different causes of colitis, including:  Viruses.  Germs (bacteria).  Medicine reactions. SYMPTOMS   Diarrhea.  Intestinal bleeding.  Pain.  Fever.  Throwing up (vomiting).  Tiredness (fatigue).  Weight loss.  Bowel blockage. DIAGNOSIS  The diagnosis of colitis is based on examination and stool or blood tests. X-rays, CT scan, and colonoscopy may also be needed. TREATMENT  Treatment may include:  Fluids given through the vein (intravenously).  Bowel rest (nothing to eat or drink for a period of time).  Medicine for pain and diarrhea.  Medicines (antibiotics) that kill germs.  Cortisone medicines.  Surgery. HOME CARE INSTRUCTIONS   Get plenty of rest.  Drink enough water and fluids to keep your urine clear or pale yellow.  Eat a well-balanced diet.  Call your caregiver for follow-up as recommended. SEEK IMMEDIATE MEDICAL CARE IF:   You develop chills.  You have an oral temperature above 102 F (38.9 C), not controlled by medicine.  You have extreme weakness, fainting, or dehydration.  You have repeated vomiting.  You develop severe belly (abdominal) pain or are passing bloody or tarry stools. MAKE SURE YOU:   Understand these instructions.  Will watch your condition.  Will get help right away if you are not doing well or get worse. Document Released: 12/05/2004 Document Revised: 01/20/2012 Document Reviewed: 03/02/2010 [facility] Patient Information 2014 [place], [place].    Irritable Bowel Syndrome Irritable Bowel Syndrome (IBS) is caused by a disturbance of normal bowel function. Other  terms used are spastic colon, mucous colitis, and irritable colon. It does not require surgery, nor does it lead to cancer. There is no cure for IBS. But with proper diet, stress reduction, and medication, you will find that your problems (symptoms) will gradually disappear or improve. IBS is a common digestive disorder. It usually appears in late adolescence or early adulthood. Women develop it twice as often as men. CAUSES  After food has been digested and absorbed in the small intestine, waste material is moved into the colon (large intestine). In the colon, water and salts are absorbed from the undigested products coming from the small intestine. The remaining residue, or fecal material, is held for elimination. Under normal circumstances, gentle, rhythmic contractions on the bowel walls push the fecal material along the colon towards the rectum. In IBS, however, these contractions are irregular and poorly coordinated. The fecal material is either retained too long, resulting in constipation, or expelled too soon, producing diarrhea. SYMPTOMS  The most common symptom of IBS is pain. It is typically in the lower left side of the belly (abdomen). But it may occur anywhere in the abdomen. It can be felt as heartburn, backache, or even as a dull pain in the arms or shoulders. The pain comes from excessive bowel-muscle spasms and from the buildup of gas and fecal material in the colon. This pain:  Can range from sharp belly (abdominal) cramps to a dull, continuous ache.  Usually worsens soon after eating.  Is typically relieved by having a bowel movement or passing gas. Abdominal pain is usually accompanied by constipation. But it may also produce diarrhea. The diarrhea typically occurs  right after a meal or upon arising in the morning. The stools are typically soft and watery. They are often flecked with secretions (mucus). Other symptoms of IBS include:  Bloating.  Loss of  appetite.  Heartburn.  Feeling sick to your stomach (nausea).  Belching  Vomiting  Gas. IBS may also cause a number of symptoms that are unrelated to the digestive system:  Fatigue.  Headaches.  Anxiety  Shortness of breath  Difficulty in concentrating.  Dizziness. These symptoms tend to come and go. DIAGNOSIS  The symptoms of IBS closely mimic the symptoms of other, more serious digestive disorders. So your caregiver may wish to perform a variety of additional tests to exclude these disorders. He/she wants to be certain of learning what is wrong (diagnosis). The nature and purpose of each test will be explained to you. TREATMENT A number of medications are available to help correct bowel function and/or relieve bowel spasms and abdominal pain. Among the drugs available are:  Mild, non-irritating laxatives for severe constipation and to help restore normal bowel habits.  Specific anti-diarrheal medications to treat severe or prolonged diarrhea.  Anti-spasmodic agents to relieve intestinal cramps.  Your caregiver may also decide to treat you with a mild tranquilizer or sedative during unusually stressful periods in your life. The important thing to remember is that if any drug is prescribed for you, make sure that you take it exactly as directed. Make sure that your caregiver knows how well it worked for you. HOME CARE INSTRUCTIONS   Avoid foods that are high in fat or oils. Some examples UJW:JXBJYare:heavy cream, butter, frankfurters, sausage, and other fatty meats.  Avoid foods that have a laxative effect, such as fruit, fruit juice, and dairy products.  Cut out carbonated drinks, chewing gum, and "gassy" foods, such as beans and cabbage. This may help relieve bloating and belching.  Bran taken with plenty of liquids may help relieve constipation.  Keep track of what foods seem to trigger your symptoms.  Avoid emotionally charged situations or circumstances that produce  anxiety.  Start or continue exercising.  Get plenty of rest and sleep. MAKE SURE YOU:   Understand these instructions.  Will watch your condition.  Will get help right away if you are not doing well or get worse. Document Released: 10/28/2005 Document Revised: 01/20/2012 Document Reviewed: 06/17/2008 [facility] Patient Information 2014 [place].

## 2014-04-05 NOTE — Discharge Summary (Signed)
Physician Discharge Summary  Kristin [name] ZOX:[id number] DOB: 12/03/1980 DOA: 04/02/2014  PCP: Cassell SmilesFUSCO,LAWRENCE J., MD  Admit date: 04/02/2014 Discharge date: 04/05/2014  Time spent: >45 minutes  Recommendations for Outpatient Follow-up:  1. F/u with GI  Discharge Diagnoses:  Principal Problem:   Colitis, acute Active Problems:   Nausea & vomiting   Abdominal pain   Intractable vomiting   IBS (irritable bowel syndrome)   Tobacco abuse   Diabetes mellitus type I   Acute colitis   Discharge Condition: stable  Diet recommendation: low fiber diet  Filed Weights   04/02/14 1223 04/02/14 2039  Weight: 54.432 kg (120 lb) 55.8 kg (123 lb 0.3 oz)    History of present illness:  Kristin [name] is a 33 y.o. female presenting on 04/02/2014 with has a past medical history of Diabetes mellitus type 1; Asthma; and IBS (irritable bowel syndrome) who presents with vomiting and is found to have a colitis on CT scan  Hospital Course:  Principal Problem:  Colitis, acute  - still quite tender in LLQ but keeping down medications and liquids and anxious to go home therefore will allow her to go home-giving strong pain medications on d/c - cont Cipro and Flagyl for total of 7 days- avoid a high fiber diet - have also given her the number for Dr Jonette EvaSandi Fields for f/u after discharge - close f/u with her PCP is recommended.   Active Problems:  Nausea & vomiting  - controlled now without Phenergan- last dose was > 24 hrs ago  Abdominal pain  - controlled with Dilaudid  - will be switching to Oxycontin and Oxycodone on d/c today  Tobacco abuse  - Nicotine patch   Diabetes mellitus type I  - on low dose insulin due to decreased PO intake  Anxiety  - Ativan PRN is helping to control this  IBS - have recommended to discuss with GI   Procedures:  none  Consultations:  GI  Discharge Exam: Filed Vitals:   04/05/14 0440  BP: 131/79  Pulse: 77  Temp: 98.7 F (37.1 C)  Resp:  20   General: No acute respiratory distress  Lungs: Clear to auscultation bilaterally without wheezes or crackles  Cardiovascular: Regular rate and rhythm without murmur gallop or rub normal S1 and S2  Abdomen: tender in LLQ, nondistended, soft, bowel sounds positive, no rebound, no ascites, no appreciable mass  Extremities: No significant cyanosis, clubbing, or edema bilateral lower extremities   Discharge Instructions You were cared for by a hospitalist during your hospital stay. If you have any questions about your discharge medications or the care you received while you were in the hospital after you are discharged, you can call the unit and asked to speak with the hospitalist on call if the hospitalist that took care of you is not available. Once you are discharged, your primary care physician will handle any further medical issues. Please note that NO REFILLS for any discharge medications will be authorized once you are discharged, as it is imperative that you return to your primary care physician (or establish a relationship with a primary care physician if you do not have one) for your aftercare needs so that they can reassess your need for medications and monitor your lab values.     Medication List    ASK your doctor about these medications       HYDROcodone-acetaminophen 5-500 MG per tablet  Commonly known as:  VICODIN  Take 1 tablet by mouth every  6 (six) hours as needed. Pain     insulin aspart 100 UNIT/ML injection  Commonly known as:  novoLOG  Inject into the skin 3 (three) times daily before meals. We she eats     insulin lispro protamine-lispro (75-25) 100 UNIT/ML Susp injection  Commonly known as:  HUMALOG 75/25 MIX  Inject 15 Units into the skin 2 (two) times daily with a meal.     QUEtiapine 300 MG 24 hr tablet  Commonly known as:  SEROQUEL XR  Take 300 mg by mouth at bedtime.       Allergies  Allergen Reactions  . Sulfa Antibiotics Hives      The  results of significant diagnostics from this hospitalization (including imaging, microbiology, ancillary and laboratory) are listed below for reference.    Significant Diagnostic Studies: Ct Abdomen Pelvis W Contrast  04/02/2014   CLINICAL DATA:  Upper abdominal pain.  Nausea and vomiting.  EXAM: CT ABDOMEN AND PELVIS WITH CONTRAST  TECHNIQUE: Multidetector CT imaging of the abdomen and pelvis was performed using the standard protocol following bolus administration of intravenous contrast.  CONTRAST:  50mL OMNIPAQUE IOHEXOL 300 MG/ML SOLN, OMNIPAQUE IOHEXOL 300 MG/ML SOLN  COMPARISON:  CT abdomen pelvis 08/07/2012.  FINDINGS: Visualization of the lower thorax demonstrates no consolidative or nodular pulmonary opacities. No pleural effusion. Normal heart size.  Liver is normal in contour. Focal fatty deposition adjacent to the falciform ligament. Gallbladder is unremarkable. Spleen, pancreas and bilateral adrenal glands are unremarkable. Kidneys enhance symmetrically with contrast. No hydronephrosis.  Normal caliber abdominal aorta. Urinary bladder is unremarkable. The uterus and adnexal structures are unremarkable. Bilateral tubal ligation clips.  Colon is relatively decompressed. There is suggestion of wall thickening of transverse and descending colon. Small amount of free fluid within the pelvis. No evidence for bowel obstruction. No free intraperitoneal air.  No aggressive or acute appearing osseous lesions.  IMPRESSION: Descending and sigmoid colon is relatively decompressed limiting evaluation however there is suggestion of wall thickening which may represent colitis in the appropriate clinical setting. Small amount of free fluid in the pelvis.   Electronically Signed   By: Annia Belt M.D.   On: 04/02/2014 17:05    Microbiology: No results found for this or any previous visit (from the past 240 hour(s)).   Labs: Basic Metabolic Panel:  Recent Labs Lab 04/02/14 1237 04/03/14 0530  NA 139  136*  K 3.9 3.9  CL 102 102  CO2 26 23  GLUCOSE 79 329*  BUN 11 11  CREATININE 0.54 0.55  CALCIUM 9.5 7.8*   Liver Function Tests:  Recent Labs Lab 04/02/14 1235  AST 15  ALT 8  ALKPHOS 74  BILITOT 0.6  PROT 7.3  ALBUMIN 3.8    Recent Labs Lab 04/02/14 1235  LIPASE 11   No results found for this basename: AMMONIA,  in the last 168 hours CBC:  Recent Labs Lab 04/02/14 1237 04/03/14 0530 04/04/14 0507 04/05/14 0435  WBC 17.5* 13.7* 9.1 7.4  NEUTROABS 14.0*  --   --   --   HGB 13.4 11.1* 11.0* 10.6*  HCT 40.3 34.7* 33.3* 32.0*  MCV 87.8 90.1 89.5 88.4  PLT 361 300 271 266   Cardiac Enzymes: No results found for this basename: CKTOTAL, CKMB, CKMBINDEX, TROPONINI,  in the last 168 hours BNP: BNP (last 3 results) No results found for this basename: PROBNP,  in the last 8760 hours CBG:  Recent Labs Lab 04/04/14 1639 04/04/14 2102 04/04/14 2350 04/05/14  0437 04/05/14 0744  GLUCAP 242* 243* 103* 191* 185*       Signed:  Keirstyn Aydt  [facility] 04/05/2014, 10:10 AM

## 2014-04-05 NOTE — Telephone Encounter (Signed)
PT WAS A NO SHOW FOR APPT JAN 2014. PT C/O ABDOMINAL PAIN, NAUSEA & VOMITING. UDS POS FOR THC. LAST TCS 2013. I PERSONALLY REVIEWED CT WITH DR. DOVER-DEFINITE CHANGES IN TRANSVERSE COLON.  Cip/flag for 7-10 days. ADD PROBIOTIC DAILY. STOP USING MARIJUANA WHICH CAN GENERATE NAUSEA AND VOMTIING. OPV E30 4-6 WEEKS Dx: COLITIS TO CONSIDER TCS IF ABD PAIN NOT IMPROVED.

## 2014-04-05 NOTE — Care Management Note (Signed)
    Page 1 of 1   04/05/2014     10:37:39 AM CARE MANAGEMENT NOTE 04/05/2014  Patient:  Kristin [name], Kristin [name]   Account Number:  [id number]  Date Initiated:  04/05/2014  Documentation initiated by:  Sharrie Rothman  Subjective/Objective Assessment:   Pt admitted from home with colitis. Pt lives with her mother and will return home at discharge. Pt stated that she has a glucometer. Pt has PCP with Dr. Sherwood Gambler and Dr. Willaim Bane in [place].     Action/Plan:   Dr. Willaim Bane gives her sample of insulin and seroquel. Pt is independent with ADL's. No CM needs noted.   Anticipated DC Date:  04/06/2014   Anticipated DC Plan:  HOME/SELF CARE  In-house referral  Financial Counselor      DC Planning Services  CM consult      Choice offered to / List presented to:             Status of service:  Completed, signed off Medicare Important Message given?   (If response is "NO", the following Medicare IM given date fields will be blank) Date Medicare IM given:   Date Additional Medicare IM given:    Discharge Disposition:  HOME/SELF CARE  Per UR Regulation:    If discussed at Long Length of Stay Meetings, dates discussed:    Comments:  04/05/14 1035 Arlyss Queen, RN BSN CM

## 2014-04-05 NOTE — Progress Notes (Signed)
The patient is receiving Pepcid by the intravenous route.  Based on criteria approved by the Pharmacy and Therapeutics Committee and the Medical Executive Committee, the medication is being converted to the equivalent oral dose form.  These criteria include: -No Active GI bleeding -Able to tolerate diet of full liquids (or better) or tube feeding OR able to tolerate other medications by the oral or enteral route  If you have any questions about this conversion, please contact the Pharmacy Department (ext [phone]).  Thank you.  Mercy Riding Ozora, [facility] 04/05/2014 9:34 AM

## 2014-04-06 ENCOUNTER — Encounter: Payer: Self-pay | Admitting: Gastroenterology

## 2014-04-06 NOTE — Telephone Encounter (Signed)
Pt is aware of OV on 6/25 at 10 with SF and appt card mailed

## 2014-05-05 ENCOUNTER — Encounter (INDEPENDENT_AMBULATORY_CARE_PROVIDER_SITE_OTHER): Payer: Self-pay

## 2014-05-05 ENCOUNTER — Ambulatory Visit (INDEPENDENT_AMBULATORY_CARE_PROVIDER_SITE_OTHER): Payer: Self-pay | Admitting: Gastroenterology

## 2014-05-05 ENCOUNTER — Other Ambulatory Visit: Payer: Self-pay | Admitting: Gastroenterology

## 2014-05-05 ENCOUNTER — Encounter: Payer: Self-pay | Admitting: Gastroenterology

## 2014-05-05 VITALS — BP 143/84 | HR 98 | Temp 98.9°F | Ht 63.0 in | Wt 117.0 lb

## 2014-05-05 DIAGNOSIS — K921 Melena: Secondary | ICD-10-CM

## 2014-05-05 DIAGNOSIS — K920 Hematemesis: Secondary | ICD-10-CM

## 2014-05-05 DIAGNOSIS — R110 Nausea: Secondary | ICD-10-CM

## 2014-05-05 MED ORDER — PROMETHAZINE HCL 12.5 MG PO TABS: ORAL_TABLET | ORAL | Status: DC

## 2014-05-05 MED ORDER — PEG 3350-KCL-NA BICARB-NACL 420 G PO SOLR: 4000.0000 mL | ORAL | Status: DC

## 2014-05-05 MED ORDER — OMEPRAZOLE 20 MG PO CPDR: 20.0000 mg | DELAYED_RELEASE_CAPSULE | Freq: Two times a day (BID) | ORAL | Status: DC

## 2014-05-05 NOTE — Assessment & Plan Note (Signed)
ASSOCIATED WITH DROP IN Hb. NOW RESOLVED LIKELY DUE TO MW TEAR OR GASTRITIS.  EGD WITH PROPOFOL DUE TO POLYPHARMACY BID PPI PHENERGAN PRN OPV IN 3 MOS

## 2014-05-05 NOTE — Patient Instructions (Addendum)
START OMEPRAZOLE.  TAKE 30 MINUTES PRIOR TO YOUR MEALS TWICE DAILY.  USE PHENERGAN AS NEEDED FOR NAUSEA/VOMITING.  FOLLOW A LOW FAT/DIABETIC DIET DIET. SEE INFO BELOW ON A LOW FAT DIET.  ENDOSCOPY ON July 7. HOLD NOVOLOG ON MORNING OF ENDOSCOPY. YOU MAY USE HUMALOG.  FOLLOW UP IN 3 MOS.    Low-Fat Diet BREADS, CEREALS, PASTA, RICE, DRIED PEAS, AND BEANS These products are high in carbohydrates and most are low in fat. Therefore, they can be increased in the diet as substitutes for fatty foods. They too, however, contain calories and should not be eaten in excess. Cereals can be eaten for snacks as well as for breakfast.   FRUITS AND VEGETABLES It is good to eat fruits and vegetables. Besides being sources of fiber, both are rich in vitamins and some minerals. They help you get the daily allowances of these nutrients. Fruits and vegetables can be used for snacks and desserts.  MEATS Limit lean meat, chicken, [place]key, and fish to no more than 6 ounces per day. Beef, Pork, and Lamb Use lean cuts of beef, pork, and lamb. Lean cuts include:  Extra-lean ground beef.  Arm roast.  Sirloin tip.  Center-cut ham.  Round steak.  Loin chops.  Rump roast.  Tenderloin.  Trim all fat off the outside of meats before cooking. It is not necessary to severely decrease the intake of red meat, but lean choices should be made. Lean meat is rich in protein and contains a highly absorbable form of iron. Premenopausal women, in particular, should avoid reducing lean red meat because this could increase the risk for low red blood cells (iron-deficiency anemia).  Chicken and [place]key These are good sources of protein. The fat of poultry can be reduced by removing the skin and underlying fat layers before cooking. Chicken and [place] can be substituted for lean red meat in the diet. Poultry should not be fried or covered with high-fat sauces. Fish and Shellfish Fish is a good source of protein. Shellfish contain  cholesterol, but they usually are low in saturated fatty acids. The preparation of fish is important. Like chicken and [place], they should not be fried or covered with high-fat sauces. EGGS Egg whites contain no fat or cholesterol. They can be eaten often. Try 1 to 2 egg whites instead of whole eggs in recipes or use egg substitutes that do not contain yolk. MILK AND DAIRY PRODUCTS Use skim or 1% milk instead of 2% or whole milk. Decrease whole milk, natural, and processed cheeses. Use nonfat or low-fat (2%) cottage cheese or low-fat cheeses made from vegetable oils. Choose nonfat or low-fat (1 to 2%) yogurt. Experiment with evaporated skim milk in recipes that call for heavy cream. Substitute low-fat yogurt or low-fat cottage cheese for sour cream in dips and salad dressings. Have at least 2 servings of low-fat dairy products, such as 2 glasses of skim (or 1%) milk each day to help get your daily calcium intake. FATS AND OILS Reduce the total intake of fats, especially saturated fat. Butterfat, lard, and beef fats are high in saturated fat and cholesterol. These should be avoided as much as possible. Vegetable fats do not contain cholesterol, but certain vegetable fats, such as coconut oil, palm oil, and palm kernel oil are very high in saturated fats. These should be limited. These fats are often used in bakery goods, processed foods, popcorn, oils, and nondairy creamers. Vegetable shortenings and some peanut butters contain hydrogenated oils, which are also saturated fats.  Read the labels on these foods and check for saturated vegetable oils. Unsaturated vegetable oils and fats do not raise blood cholesterol. However, they should be limited because they are fats and are high in calories. Total fat should still be limited to 30% of your daily caloric intake. Desirable liquid vegetable oils are corn oil, cottonseed oil, olive oil, canola oil, safflower oil, soybean oil, and sunflower oil. Peanut oil is not  as good, but small amounts are acceptable. Buy a heart-healthy tub margarine that has no partially hydrogenated oils in the ingredients. Mayonnaise and salad dressings often are made from unsaturated fats, but they should also be limited because of their high calorie and fat content. Seeds, nuts, peanut butter, olives, and avocados are high in fat, but the fat is mainly the unsaturated type. These foods should be limited mainly to avoid excess calories and fat. OTHER EATING TIPS Snacks  Most sweets should be limited as snacks. They tend to be rich in calories and fats, and their caloric content outweighs their nutritional value. Some good choices in snacks are graham crackers, melba toast, soda crackers, bagels (no egg), English muffins, fruits, and vegetables. These snacks are preferable to snack crackers, Jamaica[place] fries, TORTILLA CHIPS, and POTATO chips. Popcorn should be air-popped or cooked in small amounts of liquid vegetable oil. Desserts Eat fruit, low-fat yogurt, and fruit ices instead of pastries, cake, and cookies. Sherbet, angel food cake, gelatin dessert, frozen low-fat yogurt, or other frozen products that do not contain saturated fat (pure fruit juice bars, frozen ice pops) are also acceptable.  COOKING METHODS Choose those methods that use little or no fat. They include: Poaching.  Braising.  Steaming.  Grilling.  Baking.  Stir-frying.  Broiling.  Microwaving.  Foods can be cooked in a nonstick pan without added fat, or use a nonfat cooking spray in regular cookware. Limit fried foods and avoid frying in saturated fat. Add moisture to lean meats by using water, broth, cooking wines, and other nonfat or low-fat sauces along with the cooking methods mentioned above. Soups and stews should be chilled after cooking. The fat that forms on top after a few hours in the refrigerator should be skimmed off. When preparing meals, avoid using excess salt. Salt can contribute to raising blood  pressure in some people.  EATING AWAY FROM HOME Order entres, potatoes, and vegetables without sauces or butter. When meat exceeds the size of a deck of cards (3 to 4 ounces), the rest can be taken home for another meal. Choose vegetable or fruit salads and ask for low-calorie salad dressings to be served on the side. Use dressings sparingly. Limit high-fat toppings, such as bacon, crumbled eggs, cheese, sunflower seeds, and olives. Ask for heart-healthy tub margarine instead of butter.

## 2014-05-05 NOTE — Progress Notes (Signed)
cc'd to pcp 
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
## 2014-05-05 NOTE — Assessment & Plan Note (Addendum)
ASSOCIATED WITH COLITIS ON CT. I PERSONALLY REVIEWED CT AND RECORD FROM 2013 TO PRESENT.  DECLINED TO GIVE PT PAIN MEDS PHENERGAN PRN LOW FAT DIET TCS WITH PROPOFOL JUL 7 OPV IN 3 MOS

## 2014-05-09 ENCOUNTER — Emergency Department (HOSPITAL_COMMUNITY)
Admission: EM | Admit: 2014-05-09 | Discharge: 2014-05-09 | Disposition: A | Discharge status: Home / Self Care | Payer: Self-pay | Attending: Emergency Medicine | Admitting: Emergency Medicine

## 2014-05-09 ENCOUNTER — Emergency Department (HOSPITAL_COMMUNITY): Payer: Self-pay

## 2014-05-09 ENCOUNTER — Encounter (HOSPITAL_COMMUNITY): Payer: Self-pay | Admitting: Emergency Medicine

## 2014-05-09 DIAGNOSIS — Z794 Long term (current) use of insulin: Secondary | ICD-10-CM | POA: Insufficient documentation

## 2014-05-09 DIAGNOSIS — R1084 Generalized abdominal pain: Secondary | ICD-10-CM | POA: Insufficient documentation

## 2014-05-09 DIAGNOSIS — F17200 Nicotine dependence, unspecified, uncomplicated: Secondary | ICD-10-CM | POA: Insufficient documentation

## 2014-05-09 DIAGNOSIS — Z9889 Other specified postprocedural states: Secondary | ICD-10-CM | POA: Insufficient documentation

## 2014-05-09 DIAGNOSIS — Z9851 Tubal ligation status: Secondary | ICD-10-CM | POA: Insufficient documentation

## 2014-05-09 DIAGNOSIS — E109 Type 1 diabetes mellitus without complications: Secondary | ICD-10-CM | POA: Insufficient documentation

## 2014-05-09 DIAGNOSIS — Z9089 Acquired absence of other organs: Secondary | ICD-10-CM | POA: Insufficient documentation

## 2014-05-09 DIAGNOSIS — J45909 Unspecified asthma, uncomplicated: Secondary | ICD-10-CM | POA: Insufficient documentation

## 2014-05-09 DIAGNOSIS — K589 Irritable bowel syndrome without diarrhea: Secondary | ICD-10-CM | POA: Insufficient documentation

## 2014-05-09 DIAGNOSIS — Z3202 Encounter for pregnancy test, result negative: Secondary | ICD-10-CM | POA: Insufficient documentation

## 2014-05-09 DIAGNOSIS — Z79899 Other long term (current) drug therapy: Secondary | ICD-10-CM | POA: Insufficient documentation

## 2014-05-09 LAB — CBC WITH DIFFERENTIAL/PLATELET
BASOS ABS: 0 10*3/uL (ref 0.0–0.1)
Basophils Relative: 0 % (ref 0–1)
Eosinophils Absolute: 0 10*3/uL (ref 0.0–0.7)
Eosinophils Relative: 0 % (ref 0–5)
HCT: 42.3 % (ref 36.0–46.0)
Hemoglobin: 14.4 g/dL (ref 12.0–15.0)
LYMPHS ABS: 2.5 10*3/uL (ref 0.7–4.0)
LYMPHS PCT: 20 % (ref 12–46)
MCH: 30.1 pg (ref 26.0–34.0)
MCHC: 34 g/dL (ref 30.0–36.0)
MCV: 88.5 fL (ref 78.0–100.0)
Monocytes Absolute: 0.2 10*3/uL (ref 0.1–1.0)
Monocytes Relative: 2 % — ABNORMAL LOW (ref 3–12)
NEUTROS PCT: 78 % — AB (ref 43–77)
Neutro Abs: 9.6 10*3/uL — ABNORMAL HIGH (ref 1.7–7.7)
PLATELETS: 370 10*3/uL (ref 150–400)
RBC: 4.78 MIL/uL (ref 3.87–5.11)
RDW: 14.1 % (ref 11.5–15.5)
WBC: 12.4 10*3/uL — ABNORMAL HIGH (ref 4.0–10.5)

## 2014-05-09 LAB — COMPREHENSIVE METABOLIC PANEL
ALK PHOS: 70 U/L (ref 39–117)
ALT: 9 U/L (ref 0–35)
AST: 14 U/L (ref 0–37)
Albumin: 4 g/dL (ref 3.5–5.2)
BUN: 11 mg/dL (ref 6–23)
CO2: 28 meq/L (ref 19–32)
Calcium: 9.7 mg/dL (ref 8.4–10.5)
Chloride: 99 mEq/L (ref 96–112)
Creatinine, Ser: 0.5 mg/dL (ref 0.50–1.10)
GFR calc Af Amer: >90 mL/min (ref >90)
GLUCOSE: 121 mg/dL — AB (ref 70–99)
Potassium: 3.7 mEq/L (ref 3.7–5.3)
SODIUM: 140 meq/L (ref 137–147)
TOTAL PROTEIN: 7.6 g/dL (ref 6.0–8.3)
Total Bilirubin: 0.9 mg/dL (ref 0.3–1.2)

## 2014-05-09 LAB — URINALYSIS, ROUTINE W REFLEX MICROSCOPIC
BILIRUBIN URINE: NEGATIVE
Glucose, UA: NEGATIVE mg/dL
Hgb urine dipstick: NEGATIVE
Ketones, ur: NEGATIVE mg/dL
Leukocytes, UA: NEGATIVE
Nitrite: NEGATIVE
PH: 8.5 — AB (ref 5.0–8.0)
Protein, ur: 100 mg/dL — AB
SPECIFIC GRAVITY, URINE: 1.015 (ref 1.005–1.030)
Urobilinogen, UA: 0.2 mg/dL (ref 0.0–1.0)

## 2014-05-09 LAB — URINE MICROSCOPIC-ADD ON

## 2014-05-09 LAB — LIPASE, BLOOD: LIPASE: 11 U/L (ref 11–59)

## 2014-05-09 LAB — PREGNANCY, URINE: Preg Test, Ur: NEGATIVE

## 2014-05-09 MED ORDER — ONDANSETRON 8 MG PO TBDP: 8.0000 mg | ORAL_TABLET | Freq: Three times a day (TID) | ORAL | Status: DC | PRN

## 2014-05-09 MED ORDER — IOHEXOL 300 MG/ML  SOLN
100.0000 mL | Freq: Once | INTRAMUSCULAR | Status: AC | PRN
  Administered 2014-05-09: 100 mL via INTRAVENOUS

## 2014-05-09 MED ORDER — HYDROMORPHONE HCL PF 1 MG/ML IJ SOLN
1.0000 mg | Freq: Once | INTRAMUSCULAR | Status: AC
  Administered 2014-05-09: 1 mg via INTRAVENOUS
  Filled 2014-05-09: qty 1

## 2014-05-09 MED ORDER — PROMETHAZINE HCL 25 MG/ML IJ SOLN
25.0000 mg | Freq: Once | INTRAMUSCULAR | Status: AC
  Administered 2014-05-09: 25 mg via INTRAVENOUS
  Filled 2014-05-09: qty 1

## 2014-05-09 MED ORDER — ONDANSETRON HCL 4 MG/2ML IJ SOLN
INTRAMUSCULAR | Status: AC
  Filled 2014-05-09: qty 2

## 2014-05-09 MED ORDER — ONDANSETRON HCL 4 MG/2ML IJ SOLN
4.0000 mg | Freq: Once | INTRAMUSCULAR | Status: AC
  Administered 2014-05-09: 4 mg via INTRAVENOUS

## 2014-05-09 MED ORDER — HYDROMORPHONE HCL 2 MG PO TABS: 2.0000 mg | ORAL_TABLET | ORAL | Status: DC | PRN

## 2014-05-09 MED ORDER — ONDANSETRON HCL 4 MG/2ML IJ SOLN
4.0000 mg | Freq: Once | INTRAMUSCULAR | Status: DC
  Filled 2014-05-09: qty 2

## 2014-05-09 MED ORDER — IOHEXOL 300 MG/ML  SOLN
50.0000 mL | Freq: Once | INTRAMUSCULAR | Status: AC | PRN
  Administered 2014-05-09: 50 mL via ORAL

## 2014-05-09 MED ORDER — SODIUM CHLORIDE 0.9 % IV BOLUS (SEPSIS)
1000.0000 mL | Freq: Once | INTRAVENOUS | Status: AC
  Administered 2014-05-09: 1000 mL via INTRAVENOUS

## 2014-05-09 NOTE — ED Provider Notes (Signed)
CSN: [id number]     Arrival date & time 05/09/14  1648 History  This chart was scribed for Hilario Quarryanielle S Ray, MD,  by Ashley JacobsBrittany Andrews, ED Scribe. The patient was seen in room APA02/APA02 and the patient's care was started at 5:25 PM.   First MD Initiated Contact with Patient 05/09/14 1706     Chief Complaint  Patient presents with  . Emesis     (Consider location/radiation/quality/duration/timing/severity/associated sxs/prior Treatment) Patient is a 33 y.o. female presenting with vomiting. The history is provided by the patient, medical records and the spouse. No language interpreter was used.  Emesis Severity:  Severe Timing:  Constant Progression:  Worsening Chronicity:  Recurrent Relieved by:  Nothing Worsened by:  Nothing tried Ineffective treatments:  None tried Associated symptoms: abdominal pain   Associated symptoms: no diarrhea and no fever   Risk factors: diabetes   Risk factors: no alcohol use and not pregnant now    HPI Comments: Kristin [name] is a 33 y.o. female with hx of DM type 1 and IBS who presents to the Emergency Department complaining of constant, severe, emesis that re occurred today with sudden onset. Pt has associated constant, moderate lower abdominal pain that started before the emesis onset. She was seen by Dr. Darrick PennaFields four days ago for an colonoscopy and was seen six days at the ED for the same complaint. During her visit to the ED she had a CT scan that indicated colitis. Pt was then admitted to the hospital for three days before being discharged. Denies having a prior similar episode before the initial onset.   Denies fever. Pt had an appendectomy. Her glucose is regualted. Pt tried to eat and drink today but was unsuccessful.She had a tubal ligation and denies pregnancy. Deneis vaginal discharge. Denies dysuria. Pt does not drink alcohol but does smoke.   Past Medical History  Diagnosis Date  . Diabetes mellitus type 1   . Asthma   . IBS (irritable bowel  syndrome)    Past Surgical History  Procedure Laterality Date  . Appendectomy    . Knee surgery      left  . Tubal ligation    . Wrist surgery      left  . Colonoscopy  08/24/2012    SLF: Normal mucosa in the terminal ileum/The colonic mucosa appeared normal; multiple biopsies were performed/ Small internal hemorrhoids  . Esophagogastroduodenoscopy  08/24/2012    SLF: The mucosa of the esophagus appeared normal/Non-erosive gastritis (inflammation) was found; multiple bx/The duodenal mucosa showed no abnormalities in the 2nd part of the duodenum   Family History  Problem Relation Age of Onset  . Colon cancer Neg Hx    History  Substance Use Topics  . Smoking status: Current Every Day Smoker -- 1.50 packs/day    Types: Cigarettes  . Smokeless tobacco: Not on file  . Alcohol Use: No   OB History   Grav Para Term Preterm Abortions TAB SAB Ect Mult Living                 Review of Systems  Constitutional: Negative for fever.  Gastrointestinal: Positive for nausea, vomiting and abdominal pain. Negative for diarrhea.  Genitourinary: Negative for dysuria, hematuria, vaginal bleeding, vaginal discharge and menstrual problem.  All other systems reviewed and are negative.     Allergies  Sulfa antibiotics  Home Medications   Prior to Admission medications   Medication Sig Start Date End Date Taking? Authorizing Provider  insulin aspart (NOVOLOG)  100 UNIT/ML injection Inject into the skin 3 (three) times daily before meals. We she eats    Historical Provider, MD  insulin lispro protamine-insulin lispro (HUMALOG 75/25) (75-25) 100 UNIT/ML SUSP Inject 15 Units into the skin 2 (two) times daily with a meal.    Historical Provider, MD  LORazepam (ATIVAN) 1 MG tablet Take 1 tablet (1 mg total) by mouth every 8 (eight) hours as needed for anxiety. 04/05/14   Calvert Cantor, MD  omeprazole (PRILOSEC) 20 MG capsule Take 1 capsule (20 mg total) by mouth 2 (two) times daily before a meal.  05/05/14   West Bali, MD  polyethylene glycol-electrolytes (TRILYTE) 420 G solution Take 4,000 mLs by mouth as directed. 05/05/14   West Bali, MD  promethazine (PHENERGAN) 12.5 MG tablet 1-2 PO Q6h prn nausea or vomiting 05/05/14   West Bali, MD  QUEtiapine (SEROQUEL XR) 300 MG 24 hr tablet Take 300 mg by mouth at bedtime.    Historical Provider, MD   BP 147/79  Pulse 94  Temp(Src) 97.1 F (36.2 C) (Axillary)  Resp 24  Ht 5\' 3"  (1.6 m)  Wt 117 lb (53.071 kg)  BMI 20.73 kg/m2  SpO2 98%  LMP 04/04/2014 Physical Exam  Nursing note and vitals reviewed. Constitutional: She is oriented to person, place, and time. She appears well-developed and well-nourished. She appears distressed.  HENT:  Head: Normocephalic and atraumatic.  Right Ear: External ear normal.  Left Ear: External ear normal.  Nose: Nose normal.  Mouth/Throat: Oropharynx is clear and moist.  Eyes: Conjunctivae and EOM are normal. Pupils are equal, round, and reactive to light.  Neck: Normal range of motion. Neck supple. No JVD present. No tracheal deviation present. No thyromegaly present.  Cardiovascular: Normal rate, regular rhythm, normal heart sounds and intact distal pulses.   Pulmonary/Chest: Effort normal and breath sounds normal. No respiratory distress. She has no wheezes.  Abdominal: Soft. Bowel sounds are normal. She exhibits no mass. There is tenderness. There is no guarding.  Mild diffuse ttp  Musculoskeletal: Normal range of motion.  Lymphadenopathy:    She has no cervical adenopathy.  Neurological: She is alert and oriented to person, place, and time. She has normal reflexes. No cranial nerve deficit or sensory deficit. Gait normal. GCS eye subscore is 4. GCS verbal subscore is 5. GCS motor subscore is 6.  Reflex Scores:      Bicep reflexes are 2+ on the right side and 2+ on the left side.      Patellar reflexes are 2+ on the right side and 2+ on the left side. Strength is 5/5 bilateral elbow  flexor/extensors, wrist extension/flexion, intrinsic hand strength equal Bilateral hip flexion/extension 5/5, knee flexion/extension 5/5, ankle 5/5 flexion extension    Skin: Skin is warm and dry.  Psychiatric: She has a normal mood and affect. Her behavior is normal. Judgment and thought content normal.    ED Course  Procedures (including critical care time) DIAGNOSTIC STUDIES: Oxygen Saturation is 98% on room air, normal by my interpretation.    COORDINATION OF CARE:  5:29 PM Discussed course of care with pt . Pt understands and agrees.   Labs Review Labs Reviewed  CBC WITH DIFFERENTIAL - Abnormal; Notable for the following:    WBC 12.4 (*)    Neutrophils Relative % 78 (*)    Neutro Abs 9.6 (*)    Monocytes Relative 2 (*)    All other components within normal limits  COMPREHENSIVE METABOLIC PANEL -  Abnormal; Notable for the following:    Glucose, Bld 121 (*)    All other components within normal limits  URINALYSIS, ROUTINE W REFLEX MICROSCOPIC - Abnormal; Notable for the following:    pH 8.5 (*)    Protein, ur 100 (*)    All other components within normal limits  URINE MICROSCOPIC-ADD ON - Abnormal; Notable for the following:    Squamous Epithelial / LPF FEW (*)    Casts HYALINE CASTS (*)    All other components within normal limits  PREGNANCY, URINE  LIPASE, BLOOD    Imaging Review Ct Abdomen Pelvis W Contrast  05/09/2014   CLINICAL DATA:  Pain.  Nausea.  Diarrhea.  EXAM: CT ABDOMEN AND PELVIS WITH CONTRAST  TECHNIQUE: Multidetector CT imaging of the abdomen and pelvis was performed using the standard protocol following bolus administration of intravenous contrast.  CONTRAST:  50mL OMNIPAQUE IOHEXOL 300 MG/ML SOLN, 100mL OMNIPAQUE IOHEXOL 300 MG/ML SOLN  COMPARISON:  04/02/2014  FINDINGS: The lung bases are clear.  No pleural or pericardial effusion.  No focal liver abnormality. The gallbladder appears normal. The common bile duct is mildly increased in caliber measuring  up to 7 mm. The pancreas appears normal. The spleen is unremarkable.  The adrenal glands both appear normal. Normal appearance of the kidneys. The urinary bladder appears normal. The uterus and the adnexal structures have a normal physiologic appearance.  Normal caliber of the abdominal aorta. No aneurysm. There is no upper abdominal adenopathy. No pelvic or inguinal adenopathy.  The stomach is normal. The small bowel loops have a normal course and caliber. No obstruction. Previous appendectomy. Normal appearance of the colon.  No free fluid or abnormal fluid collections identified within the abdomen or pelvis.  Review of the visualized osseous structures is on unremarkable.  IMPRESSION: 1. No acute findings identified within the abdomen or pelvis. 2. Mild increase caliber of the common bile duct measuring 7 mm.   Electronically Signed   By: Signa Kellaylor  Stroud M.D.   On: 05/09/2014 21:51     EKG Interpretation None      MDM   Final diagnoses:  Generalized abdominal pain   This is a 33 year old female who was recently admitted with colitis who had been doing okay at home until today when she had worsening abdominal pain. She has not have any pain medicine at home. She has not noted any fever. Her CT shows no acute findings in the abdomen and pelvis. She has a mild leukocytosis at 12,400. Pain is controlled with IV pain medicine here. She is given a prescription for pain medicine and advised followup with her gastroenterologist as previously scheduled   Hilario Quarryanielle S Ray, MD 05/09/14 2335

## 2014-05-09 NOTE — ED Notes (Signed)
Patient sitting up in bed smiling and talking at this time. Patient is slowly attempting to drink contrast. States last dose of dilaudid helped pain and rates pain at a 6.

## 2014-05-09 NOTE — Progress Notes (Signed)
Reminder in EPIC 

## 2014-05-09 NOTE — Discharge Instructions (Signed)
Use your Phenergan for nausea. Followup with your gastroenterologist as planned.  Abdominal Pain Many things can cause abdominal pain. Usually, abdominal pain is not caused by a disease and will improve without treatment. It can often be observed and treated at home. Your health care provider will do a physical exam and possibly order blood tests and X-rays to help determine the seriousness of your pain. However, in many cases, more time must pass before a clear cause of the pain can be found. Before that point, your health care provider may not know if you need more testing or further treatment. HOME CARE INSTRUCTIONS  Monitor your abdominal pain for any changes. The following actions may help to alleviate any discomfort you are experiencing:  Only take over-the-counter or prescription medicines as directed by your health care provider.  Do not take laxatives unless directed to do so by your health care provider.  Try a clear liquid diet (broth, tea, or water) as directed by your health care provider. Slowly move to a bland diet as tolerated. SEEK MEDICAL CARE IF:  You have unexplained abdominal pain.  You have abdominal pain associated with nausea or diarrhea.  You have pain when you urinate or have a bowel movement.  You experience abdominal pain that wakes you in the night.  You have abdominal pain that is worsened or improved by eating food.  You have abdominal pain that is worsened with eating fatty foods.  You have a fever. SEEK IMMEDIATE MEDICAL CARE IF:   Your pain does not go away within 2 hours.  You keep throwing up (vomiting).  Your pain is felt only in portions of the abdomen, such as the right side or the left lower portion of the abdomen.  You pass bloody or black tarry stools. MAKE SURE YOU:  Understand these instructions.   Will watch your condition.   Will get help right away if you are not doing well or get worse.  Document Released: 08/07/2005  Document Revised: 11/02/2013 Document Reviewed: 07/07/2013 [facility] Patient Information 2015 [place], [place]. This information is not intended to replace advice given to you by your health care provider. Make sure you discuss any questions you have with your health care provider.

## 2014-05-09 NOTE — ED Notes (Signed)
Patient states "the dilaudid only helped just a little bit but when I started drinking that stuff it starts hurting again." Patient has only drank approximately 30ml of contrast at this time. Patient rates pain at a 9. Dr Rosalia Hammersay aware.

## 2014-05-09 NOTE — ED Notes (Addendum)
abd pain, , vomiting. See by Dr Darrick PennaFields on 6/25  To have colonoscopy .

## 2014-05-12 ENCOUNTER — Encounter (HOSPITAL_COMMUNITY): Payer: Self-pay | Admitting: Pharmacy Technician

## 2014-05-12 ENCOUNTER — Emergency Department (HOSPITAL_COMMUNITY)
Admission: EM | Admit: 2014-05-12 | Discharge: 2014-05-12 | Disposition: A | Discharge status: Home / Self Care | Payer: Self-pay | Attending: Emergency Medicine | Admitting: Emergency Medicine

## 2014-05-12 ENCOUNTER — Encounter (HOSPITAL_COMMUNITY): Payer: Self-pay | Admitting: Emergency Medicine

## 2014-05-12 DIAGNOSIS — R197 Diarrhea, unspecified: Secondary | ICD-10-CM | POA: Insufficient documentation

## 2014-05-12 DIAGNOSIS — E119 Type 2 diabetes mellitus without complications: Secondary | ICD-10-CM | POA: Insufficient documentation

## 2014-05-12 DIAGNOSIS — Z8719 Personal history of other diseases of the digestive system: Secondary | ICD-10-CM | POA: Insufficient documentation

## 2014-05-12 DIAGNOSIS — R739 Hyperglycemia, unspecified: Secondary | ICD-10-CM

## 2014-05-12 DIAGNOSIS — F17200 Nicotine dependence, unspecified, uncomplicated: Secondary | ICD-10-CM | POA: Insufficient documentation

## 2014-05-12 DIAGNOSIS — Z794 Long term (current) use of insulin: Secondary | ICD-10-CM | POA: Insufficient documentation

## 2014-05-12 DIAGNOSIS — R112 Nausea with vomiting, unspecified: Secondary | ICD-10-CM | POA: Insufficient documentation

## 2014-05-12 DIAGNOSIS — Z79899 Other long term (current) drug therapy: Secondary | ICD-10-CM | POA: Insufficient documentation

## 2014-05-12 DIAGNOSIS — J45909 Unspecified asthma, uncomplicated: Secondary | ICD-10-CM | POA: Insufficient documentation

## 2014-05-12 LAB — I-STAT CHEM 8, ED
BUN: 6 mg/dL (ref 6–23)
CALCIUM ION: 1.11 mmol/L — AB (ref 1.12–1.23)
CHLORIDE: 109 meq/L (ref 96–112)
Creatinine, Ser: 0.6 mg/dL (ref 0.50–1.10)
Glucose, Bld: 351 mg/dL — ABNORMAL HIGH (ref 70–99)
HCT: 40 % (ref 36.0–46.0)
Hemoglobin: 13.6 g/dL (ref 12.0–15.0)
Potassium: 3.5 mEq/L — ABNORMAL LOW (ref 3.7–5.3)
SODIUM: 134 meq/L — AB (ref 137–147)
TCO2: 27 mmol/L (ref 0–100)

## 2014-05-12 LAB — I-STAT CG4 LACTIC ACID, ED: Lactic Acid, Venous: 1.14 mmol/L (ref 0.5–2.2)

## 2014-05-12 LAB — CBG MONITORING, ED
Glucose-Capillary: 336 mg/dL — ABNORMAL HIGH (ref 70–99)
Glucose-Capillary: 248 mg/dL — ABNORMAL HIGH (ref 70–99)

## 2014-05-12 MED ORDER — PROMETHAZINE HCL 25 MG RE SUPP: 25.0000 mg | Freq: Four times a day (QID) | RECTAL | Status: DC | PRN

## 2014-05-12 MED ORDER — SODIUM CHLORIDE 0.9 % IV BOLUS (SEPSIS)
1000.0000 mL | Freq: Once | INTRAVENOUS | Status: AC
  Administered 2014-05-12: 1000 mL via INTRAVENOUS

## 2014-05-12 MED ORDER — INSULIN ASPART 100 UNIT/ML ~~LOC~~ SOLN
5.0000 [IU] | Freq: Once | SUBCUTANEOUS | Status: AC
  Administered 2014-05-12: 5 [IU] via SUBCUTANEOUS
  Filled 2014-05-12: qty 1

## 2014-05-12 MED ORDER — ONDANSETRON HCL 4 MG/2ML IJ SOLN
4.0000 mg | Freq: Once | INTRAMUSCULAR | Status: AC
Start: 2014-05-12 — End: 2014-05-12
  Administered 2014-05-12: 4 mg via INTRAVENOUS
  Filled 2014-05-12: qty 2

## 2014-05-12 MED ORDER — METOCLOPRAMIDE HCL 5 MG/ML IJ SOLN
10.0000 mg | Freq: Once | INTRAMUSCULAR | Status: AC
  Administered 2014-05-12: 10 mg via INTRAVENOUS
  Filled 2014-05-12: qty 2

## 2014-05-12 MED ORDER — LORAZEPAM 2 MG/ML IJ SOLN
1.0000 mg | Freq: Once | INTRAMUSCULAR | Status: AC
  Administered 2014-05-12: 1 mg via INTRAVENOUS
  Filled 2014-05-12: qty 1

## 2014-05-12 MED ORDER — DIPHENHYDRAMINE HCL 50 MG/ML IJ SOLN
50.0000 mg | Freq: Once | INTRAMUSCULAR | Status: AC
  Administered 2014-05-12: 50 mg via INTRAVENOUS
  Filled 2014-05-12: qty 1

## 2014-05-12 NOTE — ED Notes (Signed)
Spoke to MD about pt's continued co pain.

## 2014-05-12 NOTE — ED Notes (Signed)
MD informed of pt's continued co nausea. Informed by MD that blood sugar to be rechecked and if down pt will be discharged home with Phenergan. No other orders at this time .

## 2014-05-12 NOTE — ED Provider Notes (Signed)
CSN: [id number]     Arrival date & time 05/12/14  0725 History  This chart was scribed for Kristin Gaskins, MD by Leone Payor, ED Scribe. This patient was seen in room APA08/[facility]08 and the patient's care was started 7:37 AM.     Chief Complaint  Patient presents with  . Abdominal Pain    Patient is a 33 y.o. female presenting with abdominal pain. The history is provided by the patient. No language interpreter was used.  Abdominal Pain Pain location:  Generalized Pain radiates to:  Does not radiate Pain severity:  Moderate Onset quality:  Gradual Duration:  4 weeks Timing:  Constant Progression:  Worsening Relieved by:  Nothing Associated symptoms: diarrhea, nausea and vomiting   Associated symptoms: no chest pain, no fever, no hematemesis, no hematochezia and no shortness of breath     HPI Comments: Kristin [name] is a 33 y.o. female with past medical history of IBS, DM, appendectomy, tubal ligation who presents to the Emergency Department complaining of constant, gradually worsened abdominal pain with associated nausea, vomiting and diarrhea that has been ongoing for the past 1 month. She describes the emesis as purple/pink colored but denies hematemesis. She also reports episodes of diarrhea. Patient was seen on 6/29 for the same symptoms and had negative abdominal CT at that time. Her pain was controlled with IV pain medication and was discharged home with pain medication and advised follow up with gastroenterologist. She denies fever, chest pain, SOB, hematochezia.   Past Medical History  Diagnosis Date  . Diabetes mellitus type 1   . Asthma   . IBS (irritable bowel syndrome)    Past Surgical History  Procedure Laterality Date  . Appendectomy    . Knee surgery      left  . Tubal ligation    . Wrist surgery      left  . Colonoscopy  08/24/2012    SLF: Normal mucosa in the terminal ileum/The colonic mucosa appeared normal; multiple biopsies were performed/ Small internal  hemorrhoids  . Esophagogastroduodenoscopy  08/24/2012    SLF: The mucosa of the esophagus appeared normal/Non-erosive gastritis (inflammation) was found; multiple bx/The duodenal mucosa showed no abnormalities in the 2nd part of the duodenum   Family History  Problem Relation Age of Onset  . Colon cancer Neg Hx   . Diabetes Other   . Stroke Other    History  Substance Use Topics  . Smoking status: Current Every Day Smoker -- 1.50 packs/day for 12 years    Types: Cigarettes  . Smokeless tobacco: Never Used  . Alcohol Use: No   OB History   Grav Para Term Preterm Abortions TAB SAB Ect Mult Living   2 2 2       2      Review of Systems  Constitutional: Negative for fever.  Respiratory: Negative for shortness of breath.   Cardiovascular: Negative for chest pain.  Gastrointestinal: Positive for nausea, vomiting, abdominal pain and diarrhea. Negative for hematochezia and hematemesis.  All other systems reviewed and are negative.     Allergies  Sulfa antibiotics  Home Medications   Prior to Admission medications   Medication Sig Start Date End Date Taking? Authorizing Provider  HYDROmorphone (DILAUDID) 2 MG tablet Take 1 tablet (2 mg total) by mouth every 4 (four) hours as needed for severe pain. 05/09/14   Hilario Quarry, MD  insulin aspart (NOVOLOG) 100 UNIT/ML injection Inject into the skin 3 (three) times daily before meals. 1  unit per 10 carbs as directed per sliding scale instructions    Historical Provider, MD  insulin lispro protamine-insulin lispro (HUMALOG 75/25) (75-25) 100 UNIT/ML SUSP Inject 15 Units into the skin 2 (two) times daily with a meal.    Historical Provider, MD  LORazepam (ATIVAN) 1 MG tablet Take 1 tablet (1 mg total) by mouth every 8 (eight) hours as needed for anxiety. 04/05/14   Calvert CantorSaima Rizwan, MD  omeprazole (PRILOSEC) 20 MG capsule Take 1 capsule (20 mg total) by mouth 2 (two) times daily before a meal. 05/05/14   West BaliSandi L Fields, MD  ondansetron (ZOFRAN  ODT) 8 MG disintegrating tablet Take 1 tablet (8 mg total) by mouth every 8 (eight) hours as needed for nausea or vomiting. 05/09/14   Hilario Quarryanielle S Ray, MD  polyethylene glycol-electrolytes (TRILYTE) 420 G solution Take 4,000 mLs by mouth as directed. 05/05/14   West BaliSandi L Fields, MD  promethazine (PHENERGAN) 25 MG tablet Take 25 mg by mouth every 6 (six) hours as needed for nausea or vomiting.    Historical Provider, MD  QUEtiapine (SEROQUEL XR) 300 MG 24 hr tablet Take 300 mg by mouth at bedtime.    Historical Provider, MD   BP 133/107  Pulse 78  Resp 24  Ht 5\' 2"  (1.575 m)  Wt 114 lb (51.71 kg)  BMI 20.85 kg/m2  SpO2 97%  LMP 05/05/2014 Physical Exam  Nursing note and vitals reviewed.   CONSTITUTIONAL: Patient actively gagging in the room, anxious  HEAD: Normocephalic/atraumatic EYES: EOMI/PERRL, no icterus  ENMT: Mucous membranes moist NECK: supple no meningeal signs CV: S1/S2 noted, no murmurs/rubs/gallops noted LUNGS: Lungs are clear to auscultation bilaterally, no apparent distress ABDOMEN: soft, mild diffuse tenderness, no rebound or guarding NEURO: Pt is awake/alert, moves all extremitiesx4 EXTREMITIES: pulses normal, full ROM SKIN: warm, color normal, anxious  PSYCH: no abnormalities of mood noted   ED Course  Procedures   DIAGNOSTIC STUDIES: Oxygen Saturation is 97% on RA, adequate by my interpretation.    COORDINATION OF CARE: 7:40 AM Discussed treatment plan with pt at bedside and pt agreed to plan.  8:31 AM Pt with extensive negative workup recently including negative urine pregnancy/urinalysis on 6/29 and negative CT scan on 6/29.  She has f/u with GI as well.  This appears to becoming a chronic process.  Will rehydrate and control nausea.  She is hyperglycemia but does not appear in DKA   9:43 AM Pt improved, but still reports nausea I spoke at length with patient/family.  Informed that would be unable to prescribe any further pain meds (GI had declined to give  pain meds)  Will give rectal phenergen for nausea.  Advised f/u with GI She did have small amt of blood mixed in sputum while in the ED, but this has improved and I doubt acute GI bleed at this time  Will d/c home and advised outpatient followup Glucose improved BP 128/72  Pulse 100  Temp(Src) 98.3 F (36.8 C) (Oral)  Resp 18  Ht 5\' 2"  (1.575 m)  Wt 114 lb (51.71 kg)  BMI 20.85 kg/m2  SpO2 100%  LMP 05/05/2014  Labs Review Labs Reviewed  CBG MONITORING, ED - Abnormal; Notable for the following:    Glucose-Capillary 336 (*)    All other components within normal limits  I-STAT CHEM 8, ED - Abnormal; Notable for the following:    Sodium 134 (*)    Potassium 3.5 (*)    Glucose, Bld 351 (*)  Calcium, Ion 1.11 (*)    All other components within normal limits  I-STAT CG4 LACTIC ACID, ED     MDM   Final diagnoses:  Nausea and vomiting in adult  Hyperglycemia    Nursing notes including past medical history and social history reviewed and considered in documentation Labs/vital reviewed and considered Previous records reviewed and considered   I personally performed the services described in this documentation, which was scribed in my presence. The recorded information has been reviewed and is accurate.       Kristin Gaskinsonald W Zivah Mayr, MD 05/12/14 1029

## 2014-05-12 NOTE — ED Notes (Signed)
Patient c/o upper and left lower abd pain x1 month. Per mother this is patient's 3rd visit to ER for same reason. Per mother diagnosed with colitis. Patient reports nausea, vomiting, and diarrhea. Patient moaning and dry heaving.

## 2014-05-16 ENCOUNTER — Inpatient Hospital Stay (HOSPITAL_COMMUNITY)
Admission: RE | Admit: 2014-05-16 | Discharge: 2014-05-16 | Disposition: A | Discharge status: Home / Self Care | Payer: Self-pay | Source: Ambulatory Visit

## 2014-05-16 ENCOUNTER — Encounter (HOSPITAL_COMMUNITY): Payer: Self-pay | Admitting: Anesthesiology

## 2014-05-16 ENCOUNTER — Telehealth: Payer: Self-pay | Admitting: Gastroenterology

## 2014-05-16 NOTE — Patient Instructions (Signed)
Anderson MaltaJessica M Khamis  05/16/2014   Your procedure is scheduled on:  05/17/2014  [facility]  [facility].  Call this number if you have problems the morning of surgery: [phone]   Remember:   Do not eat food or drink liquids after midnight.   Take these medicines the morning of surgery with A SIP OF WATER: dilaudid, lorazepam, zofran, phenergan. Take 1/2 of usual insulin dosage the night before.   Do not wear jewelry, make-up or nail polish.  Do not wear lotions, powders, or perfumes.   Do not shave 48 hours prior to surgery. Men may shave face and neck.  Do not bring valuables to the hospital.  [facility] not responsible for any belongings or valuables.               Contacts, dentures or bridgework may not be worn into surgery.  Leave suitcase in the car. After surgery it may be brought to your room.  For patients admitted to the hospital, discharge time is determined by your  treatment team.               Patients discharged the day of surgery will not be allowed to drive home.  Name and phone number of your driver: family  Special Instructions: N/A   Please read over the following fact sheets that you were given: Pain Booklet, Coughing and Deep Breathing, Surgical Site Infection Prevention, Anesthesia Post-op Instructions and Care and Recovery After Surgery Colonoscopy A colonoscopy is an exam to look at the entire large intestine (colon). This exam can help find problems such as tumors, polyps, inflammation, and areas of bleeding. The exam takes about 1 hour.  LET [facility]OUR HEALTH CARE PROVIDER KNOW ABOUT:   Any allergies you have.  All medicines you are taking, including vitamins, herbs, eye drops, creams, and over-the-counter medicines.  Previous problems you or members of your family have had with the use of anesthetics.  Any blood disorders you have.  Previous surgeries you have had.  Medical conditions you have. RISKS AND COMPLICATIONS  Generally,  this is a safe procedure. However, as with any procedure, complications can occur. Possible complications include:  Bleeding.  Tearing or rupture of the colon wall.  Reaction to medicines given during the exam.  Infection (rare). BEFORE THE PROCEDURE   Ask your health care provider about changing or stopping your regular medicines.  You may be prescribed an oral bowel prep. This involves drinking a large amount of medicated liquid, starting the day before your procedure. The liquid will cause you to have multiple loose stools until your stool is almost clear or light green. This cleans out your colon in preparation for the procedure.  Do not eat or drink anything else once you have started the bowel prep, unless your health care provider tells you it is safe to do so.  Arrange for someone to drive you home after the procedure. PROCEDURE   You will be given medicine to help you relax (sedative).  You will lie on your side with your knees bent.  A long, flexible tube with a light and camera on the end (colonoscope) will be inserted through the rectum and into the colon. The camera sends video back to a computer screen as it moves through the colon. The colonoscope also releases carbon dioxide gas to inflate the colon. This helps your health care provider see the area better.  During the  exam, your health care provider may take a small tissue sample (biopsy) to be examined under a microscope if any abnormalities are found.  The exam is finished when the entire colon has been viewed. AFTER THE PROCEDURE   Do not drive for 24 hours after the exam.  You may have a small amount of blood in your stool.  You may pass moderate amounts of gas and have mild abdominal cramping or bloating. This is caused by the gas used to inflate your colon during the exam.  Ask when your test results will be ready and how you will get your results. Make sure you get your test results. Document Released:  10/25/2000 Document Revised: 08/18/2013 Document Reviewed: 07/05/2013 [facility] Patient Information 2015 [place], [place]. This information is not intended to replace advice given to you by your health care provider. Make sure you discuss any questions you have with your health care provider. Esophagogastroduodenoscopy Esophagogastroduodenoscopy (EGD) is a procedure to examine the lining of the esophagus, stomach, and first part of the small intestine (duodenum). A long, flexible, lighted tube with a camera attached (endoscope) is inserted down the throat to view these organs. This procedure is done to detect problems or abnormalities, such as inflammation, bleeding, ulcers, or growths, in order to treat them. The procedure lasts about 5-20 minutes. It is usually an outpatient procedure, but it may need to be performed in emergency cases in the hospital. LET YOUR CAREGIVER KNOW ABOUT:   Allergies to food or medicine.  All medicines you are taking, including vitamins, herbs, eyedrops, and over-the-counter medicines and creams.  Use of steroids (by mouth or creams).  Previous problems you or members of your family have had with the use of anesthetics.  Any blood disorders you have.  Previous surgeries you have had.  Other health problems you have.  Possibility of pregnancy, if this applies. RISKS AND COMPLICATIONS  Generally, EGD is a safe procedure. However, as with any procedure, complications can occur. Possible complications include:  Infection.  Bleeding.  Tearing (perforation) of the esophagus, stomach, or duodenum.  Difficulty breathing or not being able to breath.  Excessive sweating.  Spasms of the larynx.  Slowed heartbeat.  Low blood pressure. BEFORE THE PROCEDURE  Do not eat or drink anything for 6-8 hours before the procedure or as directed by your caregiver.  Ask your caregiver about changing or stopping your regular medicines.  If you wear dentures, be prepared  to remove them before the procedure.  Arrange for someone to drive you home after the procedure. PROCEDURE   A vein will be accessed to give medicines and fluids. A medicine to relax you (sedative) and a pain reliever will be given through that access into the vein.  A numbing medicine (local anesthetic) may be sprayed on your throat for comfort and to stop you from gagging or coughing.  A mouth guard may be placed in your mouth to protect your teeth and to keep you from biting on the endoscope.  You will be asked to lie on your left side.  The endoscope is inserted down your throat and into the esophagus, stomach, and duodenum.  Air is put through the endoscope to allow your caregiver to view the lining of your esophagus clearly.  The esophagus, stomach, and duodenum is then examined. During the exam, your caregiver may:  Remove tissue to be examined under a microscope (biopsy) for inflammation, infection, or other medical problems.  Remove growths.  Remove objects (foreign bodies) that  are stuck.  Treat any bleeding with medicines or other devices that stop tissues from bleeding (hot cauters, clipping devices).  Widen (dilate) or stretch narrowed areas of the esophagus and stomach.  The endoscope will then be withdrawn. AFTER THE PROCEDURE  You will be taken to a recovery area to be monitored. You will be able to go home once you are stable and alert.  Do not eat or drink anything until the local anesthetic and numbing medicines have worn off. You may choke.  It is normal to feel bloated, have pain with swallowing, or have a sore throat for a short time. This will wear off.  Your caregiver should be able to discuss his or her findings with you. It will take longer to discuss the test results if any biopsies were taken. Document Released: 02/28/2005 Document Revised: 10/14/2012 Document Reviewed: 09/30/2012 [facility] Patient Information 2015 [place], [place]. This information  is not intended to replace advice given to you by your health care provider. Make sure you discuss any questions you have with your health care provider. PATIENT INSTRUCTIONS POST-ANESTHESIA  IMMEDIATELY FOLLOWING SURGERY:  Do not drive or operate machinery for the first twenty four hours after surgery.  Do not make any important decisions for twenty four hours after surgery or while taking narcotic pain medications or sedatives.  If you develop intractable nausea and vomiting or a severe headache please notify your doctor immediately.  FOLLOW-UP:  Please make an appointment with your surgeon as instructed. You do not need to follow up with anesthesia unless specifically instructed to do so.  WOUND CARE INSTRUCTIONS (if applicable):  Keep a dry clean dressing on the anesthesia/puncture wound site if there is drainage.  Once the wound has quit draining you may leave it open to air.  Generally you should leave the bandage intact for twenty four hours unless there is drainage.  If the epidural site drains for more than 36-48 hours please call the anesthesia department.  QUESTIONS?:  Please feel free to call your physician or the hospital operator if you have any questions, and they will be happy to assist you.

## 2014-05-16 NOTE — Telephone Encounter (Signed)
Noted  

## 2014-05-16 NOTE — Telephone Encounter (Signed)
REVIEWED.  

## 2014-05-16 NOTE — Telephone Encounter (Signed)
Patient's mother called this morning to confirm pre-op and procedure date and time. Pt is on with [facility] for tomorrow for TCS/EGD. Mother called back this afternoon to say [facility] have the procedures done there instead and for us to cancel with [facility]

## 2014-05-16 NOTE — Telephone Encounter (Signed)
Kim is aware

## 2014-05-17 ENCOUNTER — Ambulatory Visit (HOSPITAL_COMMUNITY): Admission: RE | Admit: 2014-05-17 | Payer: MEDICAID | Source: Ambulatory Visit | Admitting: Gastroenterology

## 2014-05-17 ENCOUNTER — Other Ambulatory Visit: Payer: Self-pay | Admitting: Gastroenterology

## 2014-05-17 ENCOUNTER — Encounter (HOSPITAL_COMMUNITY): Admission: RE | Payer: Self-pay | Source: Ambulatory Visit

## 2014-05-17 SURGERY — COLONOSCOPY WITH PROPOFOL
Anesthesia: Monitor Anesthesia Care

## 2014-05-17 NOTE — Telephone Encounter (Signed)
[facility] stated that Kristin [name] needed to be R/S for EGD/ED w/Propofol W/SLF, [facility] thought is would be best to let Dr. Darrick PennaFields do the procedure that he felt it was something that didn't have to be done ASAP. EGD/ED w/Propofol has been R/S w/SLF on 07/21 and I have mailed patient new instructions

## 2014-05-19 ENCOUNTER — Encounter (HOSPITAL_COMMUNITY): Payer: Self-pay | Admitting: Emergency Medicine

## 2014-05-19 ENCOUNTER — Emergency Department (HOSPITAL_COMMUNITY)
Admission: EM | Admit: 2014-05-19 | Discharge: 2014-05-19 | Disposition: A | Discharge status: Home / Self Care | Payer: Self-pay | Attending: Emergency Medicine | Admitting: Emergency Medicine

## 2014-05-19 DIAGNOSIS — F17200 Nicotine dependence, unspecified, uncomplicated: Secondary | ICD-10-CM | POA: Insufficient documentation

## 2014-05-19 DIAGNOSIS — Z3202 Encounter for pregnancy test, result negative: Secondary | ICD-10-CM | POA: Insufficient documentation

## 2014-05-19 DIAGNOSIS — Z794 Long term (current) use of insulin: Secondary | ICD-10-CM | POA: Insufficient documentation

## 2014-05-19 DIAGNOSIS — R109 Unspecified abdominal pain: Secondary | ICD-10-CM

## 2014-05-19 DIAGNOSIS — J45909 Unspecified asthma, uncomplicated: Secondary | ICD-10-CM | POA: Insufficient documentation

## 2014-05-19 DIAGNOSIS — E109 Type 1 diabetes mellitus without complications: Secondary | ICD-10-CM | POA: Insufficient documentation

## 2014-05-19 DIAGNOSIS — K589 Irritable bowel syndrome without diarrhea: Secondary | ICD-10-CM | POA: Insufficient documentation

## 2014-05-19 DIAGNOSIS — Z79899 Other long term (current) drug therapy: Secondary | ICD-10-CM | POA: Insufficient documentation

## 2014-05-19 DIAGNOSIS — R1032 Left lower quadrant pain: Secondary | ICD-10-CM | POA: Insufficient documentation

## 2014-05-19 LAB — BLOOD GAS, VENOUS
Acid-Base Excess: 1.1 mmol/L (ref 0.0–2.0)
BICARBONATE: 24.1 meq/L — AB (ref 20.0–24.0)
O2 SAT: 99.3 %
PATIENT TEMPERATURE: 37
TCO2: 21.3 mmol/L (ref 0–100)
pCO2, Ven: 31.7 mmHg — ABNORMAL LOW (ref 45.0–50.0)
pH, Ven: 7.494 — ABNORMAL HIGH (ref 7.250–7.300)
pO2, Ven: 140 mmHg — ABNORMAL HIGH (ref 30.0–45.0)

## 2014-05-19 LAB — CBC WITH DIFFERENTIAL/PLATELET
Basophils Absolute: 0 10*3/uL (ref 0.0–0.1)
Basophils Relative: 0 % (ref 0–1)
Eosinophils Absolute: 0 10*3/uL (ref 0.0–0.7)
Eosinophils Relative: 0 % (ref 0–5)
HEMATOCRIT: 38.6 % (ref 36.0–46.0)
HEMOGLOBIN: 13.5 g/dL (ref 12.0–15.0)
LYMPHS ABS: 2.2 10*3/uL (ref 0.7–4.0)
LYMPHS PCT: 19 % (ref 12–46)
MCH: 30.5 pg (ref 26.0–34.0)
MCHC: 35 g/dL (ref 30.0–36.0)
MCV: 87.3 fL (ref 78.0–100.0)
MONO ABS: 0.3 10*3/uL (ref 0.1–1.0)
Monocytes Relative: 3 % (ref 3–12)
Neutro Abs: 9.1 10*3/uL — ABNORMAL HIGH (ref 1.7–7.7)
Neutrophils Relative %: 78 % — ABNORMAL HIGH (ref 43–77)
Platelets: 378 10*3/uL (ref 150–400)
RBC: 4.42 MIL/uL (ref 3.87–5.11)
RDW: 15 % (ref 11.5–15.5)
WBC: 11.6 10*3/uL — ABNORMAL HIGH (ref 4.0–10.5)

## 2014-05-19 LAB — COMPREHENSIVE METABOLIC PANEL
ALT: 14 U/L (ref 0–35)
ANION GAP: 12 (ref 5–15)
AST: 22 U/L (ref 0–37)
Albumin: 3.9 g/dL (ref 3.5–5.2)
Alkaline Phosphatase: 61 U/L (ref 39–117)
BILIRUBIN TOTAL: 0.7 mg/dL (ref 0.3–1.2)
BUN: 8 mg/dL (ref 6–23)
CHLORIDE: 102 meq/L (ref 96–112)
CO2: 26 meq/L (ref 19–32)
CREATININE: 0.54 mg/dL (ref 0.50–1.10)
Calcium: 9 mg/dL (ref 8.4–10.5)
GFR calc Af Amer: >90 mL/min (ref >90)
Glucose, Bld: 84 mg/dL (ref 70–99)
Potassium: 3.6 mEq/L — ABNORMAL LOW (ref 3.7–5.3)
Sodium: 140 mEq/L (ref 137–147)
Total Protein: 6.7 g/dL (ref 6.0–8.3)

## 2014-05-19 LAB — URINALYSIS, ROUTINE W REFLEX MICROSCOPIC
BILIRUBIN URINE: NEGATIVE
Glucose, UA: 250 mg/dL — AB
KETONES UR: 40 mg/dL — AB
Leukocytes, UA: NEGATIVE
NITRITE: NEGATIVE
Protein, ur: NEGATIVE mg/dL
Specific Gravity, Urine: 1.025 (ref 1.005–1.030)
UROBILINOGEN UA: 0.2 mg/dL (ref 0.0–1.0)
pH: 6 (ref 5.0–8.0)

## 2014-05-19 LAB — POC URINE PREG, ED: PREG TEST UR: NEGATIVE

## 2014-05-19 LAB — URINE MICROSCOPIC-ADD ON

## 2014-05-19 LAB — LIPASE, BLOOD: Lipase: 8 U/L — ABNORMAL LOW (ref 11–59)

## 2014-05-19 MED ORDER — LORAZEPAM 2 MG/ML IJ SOLN
1.0000 mg | Freq: Once | INTRAMUSCULAR | Status: AC
  Administered 2014-05-19: 1 mg via INTRAVENOUS
  Filled 2014-05-19: qty 1

## 2014-05-19 MED ORDER — HYDROMORPHONE HCL PF 1 MG/ML IJ SOLN
1.0000 mg | Freq: Once | INTRAMUSCULAR | Status: AC
  Administered 2014-05-19: 1 mg via INTRAVENOUS
  Filled 2014-05-19: qty 1

## 2014-05-19 MED ORDER — PROMETHAZINE HCL 25 MG/ML IJ SOLN
25.0000 mg | Freq: Once | INTRAMUSCULAR | Status: AC
  Administered 2014-05-19: 25 mg via INTRAVENOUS
  Filled 2014-05-19: qty 1

## 2014-05-19 MED ORDER — DIPHENHYDRAMINE HCL 50 MG/ML IJ SOLN
25.0000 mg | Freq: Once | INTRAMUSCULAR | Status: AC
Start: 2014-05-19 — End: 2014-05-19
  Administered 2014-05-19: 25 mg via INTRAVENOUS
  Filled 2014-05-19: qty 1

## 2014-05-19 MED ORDER — SODIUM CHLORIDE 0.9 % IV SOLN
INTRAVENOUS | Status: DC
  Administered 2014-05-19: 18:00:00 via INTRAVENOUS

## 2014-05-19 MED ORDER — SODIUM CHLORIDE 0.9 % IV BOLUS (SEPSIS)
1000.0000 mL | Freq: Once | INTRAVENOUS | Status: AC
  Administered 2014-05-19: 1000 mL via INTRAVENOUS

## 2014-05-19 NOTE — ED Notes (Addendum)
Pt here for vomiting and abd pain that started this AM. Reports no relief with suppositories given on 05/12/14 ED visit. Pt reports CBG prior to ED arrival to be 140.

## 2014-05-19 NOTE — ED Notes (Signed)
Pt. States she had a BM and it was black and tarry.

## 2014-05-19 NOTE — ED Provider Notes (Signed)
CSN: [id number]     Arrival date & time 05/19/14  1608 History   First MD Initiated Contact with Patient 05/19/14 1705     Chief Complaint  Patient presents with  . Emesis     (Consider location/radiation/quality/duration/timing/severity/associated sxs/prior Treatment) Patient is a [age] y.o. female presenting with vomiting. The history is provided by the patient.  Emesis  patient here complaining of worsening chronic abdominal pain localized to her left lower quadrant. Pain characterized as persistent and sharp and similar to her prior episodes. Scheduled to have an EGD done in the near future. Seen for similar symptoms multiple times. No fever or chills. Her emesis has been nonbilious and nonbloody. Denies any dysuria hematuria. Just finished her menstrual cycle. Cannot keep down her medications. Nothing makes her symptoms better. No black or bloody stools.  Past Medical History  Diagnosis Date  . Diabetes mellitus type 1   . Asthma   . IBS (irritable bowel syndrome)    Past Surgical History  Procedure Laterality Date  . Appendectomy    . Knee surgery      left  . Tubal ligation    . Wrist surgery      left  . Colonoscopy  08/24/2012    [facility]: Normal mucosa in the terminal ileum/The colonic mucosa appeared normal; multiple biopsies were performed/ Small internal hemorrhoids  . Esophagogastroduodenoscopy  08/24/2012    [facility]: The mucosa of the esophagus appeared normal/Non-erosive gastritis (inflammation) was found; multiple bx/The duodenal mucosa showed no abnormalities in the 2nd part of the duodenum   Family History  Problem Relation Age of Onset  . Colon cancer Neg Hx   . Diabetes Other   . Stroke Other    History  Substance Use Topics  . Smoking status: Current Every Day Smoker -- 1.50 packs/day for 12 years    Types: Cigarettes  . Smokeless tobacco: Never Used  . Alcohol Use: No   OB History   Grav Para Term Preterm Abortions TAB SAB Ect Mult Living   2 2 2       2       Review of Systems  Gastrointestinal: Positive for vomiting.  All other systems reviewed and are negative.     Allergies  Sulfa antibiotics  Home Medications   Prior to Admission medications   Medication Sig Start Date End Date Taking? Authorizing Provider  insulin aspart (NOVOLOG) 100 UNIT/ML injection Inject into the skin 3 (three) times daily before meals. 1 unit per 10 carbs as directed per sliding scale instructions   Yes Historical Provider, MD  insulin lispro protamine-insulin lispro (HUMALOG 75/25) (75-25) 100 UNIT/ML SUSP Inject 14 Units into the skin 2 (two) times daily with a meal.    Yes Historical Provider, MD  LORazepam (ATIVAN) 1 MG tablet Take 1 mg by mouth 2 (two) times daily.   Yes Historical Provider, MD  mesalamine (ASACOL) 400 MG EC tablet Take 400 mg by mouth 2 (two) times daily.   Yes Historical Provider, MD  potassium chloride SA (K-DUR,KLOR-CON) 20 MEQ tablet Take 20 mEq by mouth 2 (two) times daily.   Yes Historical Provider, MD  QUEtiapine (SEROQUEL) 100 MG tablet Take 100 mg by mouth 2 (two) times daily.   Yes Historical Provider, MD  traMADol (ULTRAM) 50 MG tablet Take 50 mg by mouth every 6 (six) hours as needed. pain   Yes Historical Provider, MD  polyethylene glycol-electrolytes (TRILYTE) 420 G solution Take 4,000 mLs by mouth as directed. 05/05/14  West Bali, MD  promethazine (PHENERGAN) 25 MG suppository Place 1 suppository (25 mg total) rectally every 6 (six) hours as needed for nausea or vomiting. 05/12/14   Joya Gaskins, MD   BP 126/97  Pulse 103  Temp(Src) 99 F (37.2 C) (Oral)  Resp 17  Ht 5\' 3"  (1.6 m)  Wt 114 lb (51.71 kg)  BMI 20.20 kg/m2  SpO2 100%  LMP 05/02/2014 Physical Exam  Nursing note and vitals reviewed. Constitutional: She is oriented to person, place, and time. She appears well-developed and well-nourished.  Non-toxic appearance. No distress.  HENT:  Head: Normocephalic and atraumatic.  Eyes: Conjunctivae, EOM and  lids are normal. Pupils are equal, round, and reactive to light.  Neck: Normal range of motion. Neck supple. No tracheal deviation present. No mass present.  Cardiovascular: Normal rate, regular rhythm and normal heart sounds.  Exam reveals no gallop.   No murmur heard. Pulmonary/Chest: Effort normal and breath sounds normal. No stridor. No respiratory distress. She has no decreased breath sounds. She has no wheezes. She has no rhonchi. She has no rales.  Abdominal: Soft. Normal appearance and bowel sounds are normal. She exhibits no distension. There is tenderness in the left upper quadrant and left lower quadrant. There is guarding. There is no rigidity, no rebound and no CVA tenderness.  Musculoskeletal: Normal range of motion. She exhibits no edema and no tenderness.  Neurological: She is alert and oriented to person, place, and time. She has normal strength. No cranial nerve deficit or sensory deficit. GCS eye subscore is 4. GCS verbal subscore is 5. GCS motor subscore is 6.  Skin: Skin is warm and dry. No abrasion and no rash noted.  Psychiatric: She has a normal mood and affect. Her speech is normal and behavior is normal.    ED Course  Procedures (including critical care time) Labs Review Labs Reviewed  URINE CULTURE  CBC WITH DIFFERENTIAL  COMPREHENSIVE METABOLIC PANEL  LIPASE, BLOOD  BLOOD GAS, VENOUS  URINALYSIS, ROUTINE W REFLEX MICROSCOPIC  POC URINE PREG, ED    Imaging Review No results found.   EKG Interpretation None      MDM   Final diagnoses:  None    Patient given IV fluids and medication for pain here and feels better. Suspect that this is her chronic abdominal pain causing her symptoms. No surgical process at this time. Stable for discharge    Toy Baker, MD 05/19/14 1858

## 2014-05-19 NOTE — Discharge Instructions (Signed)

## 2014-05-20 ENCOUNTER — Telehealth: Payer: Self-pay

## 2014-05-20 LAB — URINE CULTURE
Colony Count: NO GROWTH
Culture: NO GROWTH

## 2014-05-20 MED ORDER — ONDANSETRON 4 MG PO TBDP: 4.0000 mg | ORAL_TABLET | ORAL | Status: DC | PRN

## 2014-05-20 NOTE — Telephone Encounter (Signed)
I called pt's mom and informed her. She said pt already has some of the Zofran that dissolves but it does not help either.  I asked about Phenergan PO again, and she said no, she already has some of that. I told her to make sure the pt takes sips of liquids until vomiting resolves. Go to the ED if things progress. As far as the schedule for the procedure, she is scheduled to be done in OR and Tues is Dr. Darrick PennaFields only day for OR procedures. She is already booked for 05/24/2014.

## 2014-05-20 NOTE — Telephone Encounter (Signed)
REVIEWED. AGREE. 

## 2014-05-20 NOTE — Telephone Encounter (Signed)
Reviewed records.  [facility]  1. Mild wall thickening of the colon which may reflect incomplete distention. Correlate for any clinical signs or symptoms of colitis.  Reveiwed ER records from yesterday.  Regarding nausea/vomiting:  Continue phenergan suppositories as prescribed. We can also add Zofran ODT 4mg  every 4-6 hours prn N/V. RX done. Clear liquids only. Just sips until vomiting resolves. If decreased urination, dizziness, refractory vomiting go back to ER for possible admission.   Please address timing of procedures with Dr. Darrick PennaFields.

## 2014-05-20 NOTE — Telephone Encounter (Signed)
Also routing to Tana CoastLeslie Lewis, PA , for advice since Dr. Darrick PennaFields is at the hospital doing procedures.

## 2014-05-20 NOTE — Telephone Encounter (Signed)
Pt's mom called, Estate manager/land agentBeate Tandy, and said the pt is having so much nausea and the phenergan suppositories are not helping.  She was previously scheduled for procedure on 05/17/2014 but [facility] and they would not let her out. They said they would do the procedure and then later decided not to and let her go home. Pt was [facility] yesterday ( 05/19/2014) and was given fluids.  They are aware we leave at 12:00 noon today , but I will send Dr. Darrick PennaFields the message.

## 2014-05-23 NOTE — Patient Instructions (Signed)
Kristin [name]  05/23/2014   Your procedure is scheduled on:  05/31/2014  [facility] to [facility] [place] AM.  Call this number if you have problems the morning of surgery: [phone]   Remember: Do not eat food or drink liquids after midnight.   Take these medicines the morning of surgery with A SIP OF WATER: Do not cover am blood sugar,  Call Dr Darrick Penna office regarding pm dose of Humalog, Take with a sip of water : Ativan, Zofran, Seroquel, Tramadol, and potassium   Do not wear jewelry, make-up or nail polish.  Do not wear lotions, powders, or perfumes. You may wear deodorant.  Do not shave 48 hours prior to surgery. Men may shave face and neck.  Do not bring valuables to the hospital.  [facility] is not responsible for any belongings or valuables.               Contacts, dentures or bridgework may not be worn into surgery.  Leave suitcase in the car. After surgery it may be brought to your room.  For patients admitted to the hospital, discharge time is determined by your treatment team.               Patients discharged the day of surgery will not be allowed to drive home.  Name and phone number of your driver:    Please read over the following fact sheets that you were given: Coughing and Deep Breathing and Care and Recovery After Surgery      Colonoscopy A colonoscopy is an exam to look at the entire large intestine (colon). This exam can help find problems such as tumors, polyps, inflammation, and areas of bleeding. The exam takes about 1 hour.  LET [facility] CARE PROVIDER KNOW ABOUT:   Any allergies you have.  All medicines you are taking, including vitamins, herbs, eye drops, creams, and over-the-counter medicines.  Previous problems you or members of your family have had with the use of anesthetics.  Any blood disorders you have.  Previous surgeries you have had.  Medical conditions you have. RISKS AND COMPLICATIONS  Generally, this is a safe procedure.  However, as with any procedure, complications can occur. Possible complications include:  Bleeding.  Tearing or rupture of the colon wall.  Reaction to medicines given during the exam.  Infection (rare). BEFORE THE PROCEDURE   Ask your health care provider about changing or stopping your regular medicines.  You may be prescribed an oral bowel prep. This involves drinking a large amount of medicated liquid, starting the day before your procedure. The liquid will cause you to have multiple loose stools until your stool is almost clear or light green. This cleans out your colon in preparation for the procedure.  Do not eat or drink anything else once you have started the bowel prep, unless your health care provider tells you it is safe to do so.  Arrange for someone to drive you home after the procedure. PROCEDURE   You will be given medicine to help you relax (sedative).  You will lie on your side with your knees bent.  A long, flexible tube with a light and camera on the end (colonoscope) will be inserted through the rectum and into the colon. The camera sends video back to a computer screen as it moves through the colon. The colonoscope also releases carbon dioxide gas to inflate the colon. This helps your health care provider see the area better.  During the  exam, your health care provider may take a small tissue sample (biopsy) to be examined under a microscope if any abnormalities are found.  The exam is finished when the entire colon has been viewed. AFTER THE PROCEDURE   Do not drive for 24 hours after the exam.  You may have a small amount of blood in your stool.  You may pass moderate amounts of gas and have mild abdominal cramping or bloating. This is caused by the gas used to inflate your colon during the exam.  Ask when your test results will be ready and how you will get your results. Make sure you get your test results. Document Released: 10/25/2000 Document Revised:  08/18/2013 Document Reviewed: 07/05/2013 [facility] Patient Information 2015 [employer] [place]. This information is not intended to replace advice given to you by your health care provider. Make sure you discuss any questions you have with your health care provider.   Esophagogastroduodenoscopy Esophagogastroduodenoscopy (EGD) is a procedure to examine the lining of the esophagus, stomach, and first part of the small intestine (duodenum). A long, flexible, lighted tube with a camera attached (endoscope) is inserted down the throat to view these organs. This procedure is done to detect problems or abnormalities, such as inflammation, bleeding, ulcers, or growths, in order to treat them. The procedure lasts about 5-20 minutes. It is usually an outpatient procedure, but it may need to be performed in emergency cases in the hospital. LET YOUR CAREGIVER KNOW ABOUT:   Allergies to food or medicine.  All medicines you are taking, including vitamins, herbs, eyedrops, and over-the-counter medicines and creams.  Use of steroids (by mouth or creams).  Previous problems you or members of your family have had with the use of anesthetics.  Any blood disorders you have.  Previous surgeries you have had.  Other health problems you have.  Possibility of pregnancy, if this applies. RISKS AND COMPLICATIONS  Generally, EGD is a safe procedure. However, as with any procedure, complications can occur. Possible complications include:  Infection.  Bleeding.  Tearing (perforation) of the esophagus, stomach, or duodenum.  Difficulty breathing or not being able to breath.  Excessive sweating.  Spasms of the larynx.  Slowed heartbeat.  Low blood pressure. BEFORE THE PROCEDURE  Do not eat or drink anything for 6-8 hours before the procedure or as directed by your caregiver.  Ask your caregiver about changing or stopping your regular medicines.  If you wear dentures, be prepared to remove them before  the procedure.  Arrange for someone to drive you home after the procedure. PROCEDURE   A vein will be accessed to give medicines and fluids. A medicine to relax you (sedative) and a pain reliever will be given through that access into the vein.  A numbing medicine (local anesthetic) may be sprayed on your throat for comfort and to stop you from gagging or coughing.  A mouth guard may be placed in your mouth to protect your teeth and to keep you from biting on the endoscope.  You will be asked to lie on your left side.  The endoscope is inserted down your throat and into the esophagus, stomach, and duodenum.  Air is put through the endoscope to allow your caregiver to view the lining of your esophagus clearly.  The esophagus, stomach, and duodenum is then examined. During the exam, your caregiver may:  Remove tissue to be examined under a microscope (biopsy) for inflammation, infection, or other medical problems.  Remove growths.  Remove objects (foreign  bodies) that are stuck.  Treat any bleeding with medicines or other devices that stop tissues from bleeding (hot cauters, clipping devices).  Widen (dilate) or stretch narrowed areas of the esophagus and stomach.  The endoscope will then be withdrawn. AFTER THE PROCEDURE  You will be taken to a recovery area to be monitored. You will be able to go home once you are stable and alert.  Do not eat or drink anything until the local anesthetic and numbing medicines have worn off. You may choke.  It is normal to feel bloated, have pain with swallowing, or have a sore throat for a short time. This will wear off.  Your caregiver should be able to discuss his or her findings with you. It will take longer to discuss the test results if any biopsies were taken. Document Released: 02/28/2005 Document Revised: 10/14/2012 Document Reviewed: 09/30/2012 [facility] Patient Information 2015 [place], [place]. This information is not intended to  replace advice given to you by your health care provider. Make sure you discuss any questions you have with your health care provider.

## 2014-05-24 ENCOUNTER — Encounter (HOSPITAL_COMMUNITY)
Admission: RE | Admit: 2014-05-24 | Discharge: 2014-05-24 | Disposition: A | Discharge status: Home / Self Care | Payer: Self-pay | Source: Ambulatory Visit | Attending: Gastroenterology | Admitting: Gastroenterology

## 2014-05-24 NOTE — Telephone Encounter (Signed)
[facility] and stated that Kristin BumpsJessica No Showed for her Pre Op today. Numerous of messages have been left for her and her mom  But no answer

## 2014-05-24 NOTE — Telephone Encounter (Deleted)
I have never seen patient before. Will send to [facility] to make aware.

## 2014-05-25 ENCOUNTER — Telehealth: Payer: Self-pay

## 2014-05-25 NOTE — Telephone Encounter (Signed)
Returned call and patient is aware of Pre Op Time

## 2014-05-25 NOTE — Telephone Encounter (Signed)
Pt left VM that she has a question about her pre-op appt. I looked on instructions and did not see a time for pre-op. Please call pt at [phone].

## 2014-05-26 ENCOUNTER — Other Ambulatory Visit (HOSPITAL_COMMUNITY): Payer: Self-pay

## 2014-05-26 ENCOUNTER — Encounter (HOSPITAL_COMMUNITY)
Admission: RE | Admit: 2014-05-26 | Discharge: 2014-05-26 | Disposition: A | Discharge status: Home / Self Care | Payer: Self-pay | Source: Ambulatory Visit | Attending: Gastroenterology | Admitting: Gastroenterology

## 2014-05-26 ENCOUNTER — Other Ambulatory Visit: Payer: Self-pay

## 2014-05-26 ENCOUNTER — Telehealth: Payer: Self-pay | Admitting: Gastroenterology

## 2014-05-26 NOTE — Telephone Encounter (Signed)
REVIEWED. CANCEL OR CASE. PT CAN CALL TO [facility].

## 2014-05-26 NOTE — Patient Instructions (Signed)
Kristin [name]  05/26/2014   Your procedure is scheduled on:  05/31/2014  Report to [facility] at 7 AM.  Call this number if you have problems the morning of surgery: [phone]   Remember:   Do not eat food or drink liquids after midnight.   Take these medicines the morning of surgery with A SIP OF WATER: *Ativan**   Do not wear jewelry, make-up or nail polish.  Do not wear lotions, powders, or perfumes. You may wear deodorant.  Do not shave 48 hours prior to surgery. Men may shave face and neck.  Do not bring valuables to the hospital.  [facility] is not responsible                  for any belongings or valuables.               Contacts, dentures or bridgework may not be worn into surgery.  Leave suitcase in the car. After surgery it may be brought to your room.  For patients admitted to the hospital, discharge time is determined by your                treatment team.               Patients discharged the day of surgery will not be allowed to drive  home.  Name and phone number of your driver: mother Estate manager/land agent  Special Instructions: N/A   Please read over the following fact sheets that you were given: Pain Booklet     Esophagogastroduodenoscopy Esophagogastroduodenoscopy (EGD) is a procedure to examine the lining of the esophagus, stomach, and first part of the small intestine (duodenum). A long, flexible, lighted tube with a camera attached (endoscope) is inserted down the throat to view these organs. This procedure is done to detect problems or abnormalities, such as inflammation, bleeding, ulcers, or growths, in order to treat them. The procedure lasts about 5-20 minutes. It is usually an outpatient procedure, but it may need to be performed in emergency cases in the hospital. LET YOUR CAREGIVER KNOW ABOUT:   Allergies to food or medicine.  All medicines you are taking, including vitamins, herbs, eyedrops, and over-the-counter medicines and creams.  Use of steroids  (by mouth or creams).  Previous problems you or members of your family have had with the use of anesthetics.  Any blood disorders you have.  Previous surgeries you have had.  Other health problems you have.  Possibility of pregnancy, if this applies. RISKS AND COMPLICATIONS  Generally, EGD is a safe procedure. However, as with any procedure, complications can occur. Possible complications include:  Infection.  Bleeding.  Tearing (perforation) of the esophagus, stomach, or duodenum.  Difficulty breathing or not being able to breath.  Excessive sweating.  Spasms of the larynx.  Slowed heartbeat.  Low blood pressure. BEFORE THE PROCEDURE  Do not eat or drink anything for 6-8 hours before the procedure or as directed by your caregiver.  Ask your caregiver about changing or stopping your regular medicines.  If you wear dentures, be prepared to remove them before the procedure.  Arrange for someone to drive you home after the procedure. PROCEDURE   A vein will be accessed to give medicines and fluids. A medicine to relax you (sedative) and a pain reliever will be given through that access into the vein.  A numbing medicine (local anesthetic) may be sprayed on your throat for comfort and to stop you from gagging or  coughing.  A mouth guard may be placed in your mouth to protect your teeth and to keep you from biting on the endoscope.  You will be asked to lie on your left side.  The endoscope is inserted down your throat and into the esophagus, stomach, and duodenum.  Air is put through the endoscope to allow your caregiver to view the lining of your esophagus clearly.  The esophagus, stomach, and duodenum is then examined. During the exam, your caregiver may:  Remove tissue to be examined under a microscope (biopsy) for inflammation, infection, or other medical problems.  Remove growths.  Remove objects (foreign bodies) that are stuck.  Treat any bleeding with  medicines or other devices that stop tissues from bleeding (hot cauters, clipping devices).  Widen (dilate) or stretch narrowed areas of the esophagus and stomach.  The endoscope will then be withdrawn. AFTER THE PROCEDURE  You will be taken to a recovery area to be monitored. You will be able to go home once you are stable and alert.  Do not eat or drink anything until the local anesthetic and numbing medicines have worn off. You may choke.  It is normal to feel bloated, have pain with swallowing, or have a sore throat for a short time. This will wear off.  Your caregiver should be able to discuss his or her findings with you. It will take longer to discuss the test results if any biopsies were taken. Document Released: 02/28/2005 Document Revised: 10/14/2012 Document Reviewed: 09/30/2012 [facility] Patient Information 2015 [place]. This information is not intended to replace advice given to you by your health care provider. Make sure you discuss any questions you have with your health care provider. Colonoscopy A colonoscopy is an exam to look at the entire large intestine (colon). This exam can help find problems such as tumors, polyps, inflammation, and areas of bleeding. The exam takes about 1 hour.  LET [facility] CARE PROVIDER KNOW ABOUT:   Any allergies you have.  All medicines you are taking, including vitamins, herbs, eye drops, creams, and over-the-counter medicines.  Previous problems you or members of your family have had with the use of anesthetics.  Any blood disorders you have.  Previous surgeries you have had.  Medical conditions you have. RISKS AND COMPLICATIONS  Generally, this is a safe procedure. However, as with any procedure, complications can occur. Possible complications include:  Bleeding.  Tearing or rupture of the colon wall.  Reaction to medicines given during the exam.  Infection (rare). BEFORE THE PROCEDURE   Ask your health care  provider about changing or stopping your regular medicines.  You may be prescribed an oral bowel prep. This involves drinking a large amount of medicated liquid, starting the day before your procedure. The liquid will cause you to have multiple loose stools until your stool is almost clear or light green. This cleans out your colon in preparation for the procedure.  Do not eat or drink anything else once you have started the bowel prep, unless your health care provider tells you it is safe to do so.  Arrange for someone to drive you home after the procedure. PROCEDURE   You will be given medicine to help you relax (sedative).  You will lie on your side with your knees bent.  A long, flexible tube with a light and camera on the end (colonoscope) will be inserted through the rectum and into the colon. The camera sends video back to a computer screen  as it moves through the colon. The colonoscope also releases carbon dioxide gas to inflate the colon. This helps your health care provider see the area better.  During the exam, your health care provider may take a small tissue sample (biopsy) to be examined under a microscope if any abnormalities are found.  The exam is finished when the entire colon has been viewed. AFTER THE PROCEDURE   Do not drive for 24 hours after the exam.  You may have a small amount of blood in your stool.  You may pass moderate amounts of gas and have mild abdominal cramping or bloating. This is caused by the gas used to inflate your colon during the exam.  Ask when your test results will be ready and how you will get your results. Make sure you get your test results. Document Released: 10/25/2000 Document Revised: 08/18/2013 Document Reviewed: 07/05/2013 [facility] Patient Information 2015 [place], [place]. This information is not intended to replace advice given to you by your health care provider. Make sure you discuss any questions you have with your health care  provider.        Humalog 75/25 Insulin- may take the morning of surgery.  Hold Novolog the morning of surgery.

## 2014-05-26 NOTE — Telephone Encounter (Signed)
Patient showed for Pre Op Procedure is still on

## 2014-05-26 NOTE — Telephone Encounter (Signed)
I called [facility] and the prescription was sent in on 05/05/2014 by Soledad GerlachLeigh Ann. They have it on hold because the pt does not have insurance. It will be $33.00.   I called pt and LMOM that her Rx for the prep is at the pharmacy.   Also, [facility] called and spoke to me in reference to the pt's diabetic meds for the procedure.  I told her that the instructions for diabetic meds was:   HOLD NOVOLOG ON THE MORNING OF THE PROCEDURE. MAY USE HUMALOG.  NO CHANGES FOR THE DAY OF PREP.   I also faxed the copy of the instructions to her at [phone].

## 2014-05-26 NOTE — Telephone Encounter (Signed)
Tracey from [facility] called to say that [facility]

## 2014-05-27 ENCOUNTER — Encounter (HOSPITAL_COMMUNITY): Payer: Self-pay | Admitting: Pharmacy Technician

## 2014-05-31 ENCOUNTER — Other Ambulatory Visit: Payer: Self-pay | Admitting: Gastroenterology

## 2014-05-31 ENCOUNTER — Encounter (HOSPITAL_COMMUNITY): Payer: Self-pay | Admitting: Anesthesiology

## 2014-05-31 ENCOUNTER — Ambulatory Visit (HOSPITAL_COMMUNITY): Payer: Self-pay | Admitting: Anesthesiology

## 2014-05-31 ENCOUNTER — Ambulatory Visit (HOSPITAL_COMMUNITY)
Admission: RE | Admit: 2014-05-31 | Discharge: 2014-05-31 | Disposition: A | Discharge status: Home / Self Care | Payer: Self-pay | Source: Ambulatory Visit | Attending: Gastroenterology | Admitting: Gastroenterology

## 2014-05-31 ENCOUNTER — Encounter (HOSPITAL_COMMUNITY): Payer: Self-pay | Admitting: *Deleted

## 2014-05-31 ENCOUNTER — Ambulatory Visit: Admit: 2014-05-31 | Payer: Self-pay | Admitting: Gastroenterology

## 2014-05-31 ENCOUNTER — Telehealth: Payer: Self-pay

## 2014-05-31 ENCOUNTER — Encounter (HOSPITAL_COMMUNITY)
Admission: RE | Disposition: A | Discharge status: Home / Self Care | Payer: Self-pay | Source: Ambulatory Visit | Attending: Gastroenterology

## 2014-05-31 DIAGNOSIS — F32A Depression, unspecified: Secondary | ICD-10-CM

## 2014-05-31 DIAGNOSIS — K648 Other hemorrhoids: Secondary | ICD-10-CM | POA: Insufficient documentation

## 2014-05-31 DIAGNOSIS — E109 Type 1 diabetes mellitus without complications: Secondary | ICD-10-CM | POA: Insufficient documentation

## 2014-05-31 DIAGNOSIS — R1032 Left lower quadrant pain: Secondary | ICD-10-CM | POA: Insufficient documentation

## 2014-05-31 DIAGNOSIS — K921 Melena: Secondary | ICD-10-CM

## 2014-05-31 DIAGNOSIS — R197 Diarrhea, unspecified: Secondary | ICD-10-CM | POA: Insufficient documentation

## 2014-05-31 DIAGNOSIS — K2940 Chronic atrophic gastritis without bleeding: Secondary | ICD-10-CM | POA: Insufficient documentation

## 2014-05-31 DIAGNOSIS — F411 Generalized anxiety disorder: Secondary | ICD-10-CM

## 2014-05-31 DIAGNOSIS — Z794 Long term (current) use of insulin: Secondary | ICD-10-CM | POA: Insufficient documentation

## 2014-05-31 DIAGNOSIS — K625 Hemorrhage of anus and rectum: Secondary | ICD-10-CM | POA: Insufficient documentation

## 2014-05-31 DIAGNOSIS — K449 Diaphragmatic hernia without obstruction or gangrene: Secondary | ICD-10-CM | POA: Insufficient documentation

## 2014-05-31 DIAGNOSIS — F329 Major depressive disorder, single episode, unspecified: Secondary | ICD-10-CM

## 2014-05-31 DIAGNOSIS — R112 Nausea with vomiting, unspecified: Secondary | ICD-10-CM | POA: Insufficient documentation

## 2014-05-31 DIAGNOSIS — R1115 Cyclical vomiting syndrome unrelated to migraine: Secondary | ICD-10-CM

## 2014-05-31 HISTORY — PX: ESOPHAGOGASTRODUODENOSCOPY (EGD) WITH PROPOFOL: SHX5813

## 2014-05-31 HISTORY — PX: BIOPSY: SHX5522

## 2014-05-31 HISTORY — PX: COLONOSCOPY WITH PROPOFOL: SHX5780

## 2014-05-31 LAB — GLUCOSE, CAPILLARY: Glucose-Capillary: 220 mg/dL — ABNORMAL HIGH (ref 70–99)

## 2014-05-31 SURGERY — ESOPHAGOGASTRODUODENOSCOPY (EGD) WITH PROPOFOL
Anesthesia: Monitor Anesthesia Care

## 2014-05-31 SURGERY — COLONOSCOPY WITH PROPOFOL
Anesthesia: Monitor Anesthesia Care | Site: Rectum

## 2014-05-31 MED ORDER — DICYCLOMINE HCL 10 MG PO CAPS: ORAL_CAPSULE | ORAL | Status: DC

## 2014-05-31 MED ORDER — ONDANSETRON HCL 4 MG/2ML IJ SOLN: 4.0000 mg | Freq: Once | INTRAMUSCULAR | Status: DC | PRN

## 2014-05-31 MED ORDER — PROPOFOL INFUSION 10 MG/ML OPTIME: INTRAVENOUS | Status: DC | PRN

## 2014-05-31 MED ORDER — FENTANYL CITRATE 0.05 MG/ML IJ SOLN
25.0000 ug | INTRAMUSCULAR | Status: AC
  Administered 2014-05-31 (×2): 25 ug via INTRAVENOUS

## 2014-05-31 MED ORDER — PROPOFOL INFUSION 10 MG/ML OPTIME
INTRAVENOUS | Status: DC | PRN
  Administered 2014-05-31: 100 ug/kg/min via INTRAVENOUS

## 2014-05-31 MED ORDER — PROPOFOL 10 MG/ML IV BOLUS
INTRAVENOUS | Status: AC
  Filled 2014-05-31: qty 20

## 2014-05-31 MED ORDER — GLYCOPYRROLATE 0.2 MG/ML IJ SOLN
INTRAMUSCULAR | Status: AC
  Filled 2014-05-31: qty 1

## 2014-05-31 MED ORDER — FENTANYL CITRATE 0.05 MG/ML IJ SOLN
INTRAMUSCULAR | Status: DC | PRN
  Administered 2014-05-31: 50 ug via INTRAVENOUS
  Administered 2014-05-31: 25 ug via INTRAVENOUS
  Administered 2014-05-31: 50 ug via INTRAVENOUS
  Administered 2014-05-31: 25 ug via INTRAVENOUS

## 2014-05-31 MED ORDER — LACTATED RINGERS IV SOLN
INTRAVENOUS | Status: DC
  Administered 2014-05-31: 08:00:00 via INTRAVENOUS

## 2014-05-31 MED ORDER — MIDAZOLAM HCL 2 MG/2ML IJ SOLN
1.0000 mg | INTRAMUSCULAR | Status: DC | PRN
  Administered 2014-05-31 (×2): 2 mg via INTRAVENOUS

## 2014-05-31 MED ORDER — WATER FOR IRRIGATION, STERILE IR SOLN
Status: DC | PRN
  Administered 2014-05-31: 1000 mL

## 2014-05-31 MED ORDER — FENTANYL CITRATE 0.05 MG/ML IJ SOLN
INTRAMUSCULAR | Status: AC
  Filled 2014-05-31: qty 2

## 2014-05-31 MED ORDER — SIMETHICONE 40 MG/0.6ML PO SUSP
ORAL | Status: DC | PRN
  Administered 2014-05-31: 09:00:00

## 2014-05-31 MED ORDER — FENTANYL CITRATE 0.05 MG/ML IJ SOLN: 25.0000 ug | INTRAMUSCULAR | Status: DC | PRN

## 2014-05-31 MED ORDER — MIDAZOLAM HCL 2 MG/2ML IJ SOLN
INTRAMUSCULAR | Status: AC
  Filled 2014-05-31: qty 2

## 2014-05-31 MED ORDER — ONDANSETRON HCL 4 MG/2ML IJ SOLN
4.0000 mg | Freq: Once | INTRAMUSCULAR | Status: AC
  Administered 2014-05-31: 4 mg via INTRAVENOUS

## 2014-05-31 MED ORDER — OMEPRAZOLE 20 MG PO CPDR: DELAYED_RELEASE_CAPSULE | ORAL | Status: DC

## 2014-05-31 MED ORDER — ONDANSETRON HCL 4 MG/2ML IJ SOLN
INTRAMUSCULAR | Status: AC
  Filled 2014-05-31: qty 2

## 2014-05-31 SURGICAL SUPPLY — 25 items
BLOCK BITE 60FR ADLT L/F BLUE (MISCELLANEOUS) ×2 IMPLANT
ELECT REM PT RETURN 9FT ADLT (ELECTROSURGICAL)
ELECTRODE REM PT RTRN 9FT ADLT (ELECTROSURGICAL) IMPLANT
FCP BXJMBJMB 240X2.8X (CUTTING FORCEPS)
FLOOR PAD 36X40 (MISCELLANEOUS) ×4
FORCEPS BIOP RAD 4 LRG CAP 4 (CUTTING FORCEPS) ×3 IMPLANT
FORCEPS BIOP RJ4 240 W/NDL (CUTTING FORCEPS)
FORCEPS BXJMBJMB 240X2.8X (CUTTING FORCEPS) IMPLANT
FORMALIN 10 PREFIL 20ML (MISCELLANEOUS) ×2 IMPLANT
INJECTOR/SNARE I SNARE (MISCELLANEOUS) IMPLANT
KIT CLEAN ENDO COMPLIANCE (KITS) ×4 IMPLANT
LUBRICANT JELLY 4.5OZ STERILE (MISCELLANEOUS) ×3 IMPLANT
MANIFOLD NEPTUNE II (INSTRUMENTS) ×4 IMPLANT
NDL SCLEROTHERAPY 25GX240 (NEEDLE) IMPLANT
NEEDLE SCLEROTHERAPY 25GX240 (NEEDLE) IMPLANT
PAD FLOOR 36X40 (MISCELLANEOUS) ×2 IMPLANT
PROBE APC STR FIRE (PROBE) IMPLANT
PROBE INJECTION GOLD (MISCELLANEOUS)
PROBE INJECTION GOLD 7FR (MISCELLANEOUS) IMPLANT
SNARE SHORT THROW 13M SML OVAL (MISCELLANEOUS) ×4 IMPLANT
SYR 50ML LL SCALE MARK (SYRINGE) ×2 IMPLANT
SYR INFLATION 60ML (SYRINGE) ×2 IMPLANT
TRAP SPECIMEN MUCOUS 40CC (MISCELLANEOUS) ×3 IMPLANT
TUBING IRRIGATION ENDOGATOR (MISCELLANEOUS) ×3 IMPLANT
WATER STERILE IRR 1000ML POUR (IV SOLUTION) ×2 IMPLANT

## 2014-05-31 NOTE — Anesthesia Preprocedure Evaluation (Signed)
Anesthesia Evaluation  Patient identified by MRN, date of birth, ID band Patient awake    Reviewed: Allergy & Precautions, H&P , NPO status , Patient's Chart, lab work & pertinent test results  Airway Mallampati: I TM Distance: >3 FB     Dental  (+) Teeth Intact   Pulmonary asthma , Current Smoker,  breath sounds clear to auscultation        Cardiovascular negative cardio ROS  Rhythm:Regular Rate:Tachycardia     Neuro/Psych    GI/Hepatic GERD-  Medicated,  Endo/Other  diabetes, Poorly Controlled, Type 1, Insulin Dependent  Renal/GU      Musculoskeletal   Abdominal   Peds  Hematology   Anesthesia Other Findings   Reproductive/Obstetrics                           Anesthesia Physical Anesthesia Plan  ASA: III  Anesthesia Plan: MAC   Post-op Pain Management:    Induction: Intravenous  Airway Management Planned: Simple Face Mask  Additional Equipment:   Intra-op Plan:   Post-operative Plan:   Informed Consent: I have reviewed the patients History and Physical, chart, labs and discussed the procedure including the risks, benefits and alternatives for the proposed anesthesia with the patient or authorized representative who has indicated his/her understanding and acceptance.     Plan Discussed with:   Anesthesia Plan Comments:         Anesthesia Quick Evaluation

## 2014-05-31 NOTE — Discharge Instructions (Signed)
YOU DID NOT HAVE ANY POLYPS. You have internal hemorrhoids. YOU HAVE gastritis. I biopsied your stomach, SMALL BOWEL, & colon. YOUR nausea IS MOST LIKELY DUE TO IBS/GASTRITISGERD. YOUR RECTAL BLEEDING IS DUE TO INTERNAL HEMORRHOIDS. NO OBVIOUS SOURCE FOR YOUR DIARRHEA WAS IDENTIFIED.    ADD DICYCLOMINE 10 MG TABLETS ONE OR TWO 30 MINUTES PRIOR MEALS UP TO THREE TIMES A DAY AND AT BEDTIME. IT MAY CAUSE DROWSINESS, DRY EYES/MOUTH, BLURRY VISION, OR DIFFICULTY URINATING.  AVOID ASPIRIN, BC/GOODY POWDERS, IBUPROFEN/MOTRIN, OR NAPROXEN/ALEVE FOR 2 WEEKS BECAUSE YOU HAVE A GASTRITIS/DUODENITIS.  TAKE OMEPRAZOLE 30 MINUTES PRIOR TO MEALS TWICE DAILY FOR 3 MOS THE ONCE DAILY.  AVOID ITEMS THAT TRIGGER REFLUX. SEE INFO BELOW.  FOLLOW A DIABETIC/LOW FAT/DAIRY FREE DIET. AVOID ITEMS THAT CAUSE BLOATING. SEE INFO BELOW ON LOW FAT/DAIRY FREE.  YOUR BIOPSY RESULTS WILL BE BACK IN 7 DAYS.  FOLLOW UP IN 3 MOS.     ENDOSCOPY Care After Read the instructions outlined below and refer to this sheet in the next week. These discharge instructions provide you with general information on caring for yourself after you leave the hospital. While your treatment has been planned according to the most current medical practices available, unavoidable complications occasionally occur. If you have any problems or questions after discharge, call DR. Jancarlos Thrun, [phone].  ACTIVITY  You may resume your regular activity, but move at a slower pace for the next 24 hours.   Take frequent rest periods for the next 24 hours.   Walking will help get rid of the air and reduce the bloated feeling in your belly (abdomen).   No driving for 24 hours (because of the medicine (anesthesia) used during the test).   You may shower.   Do not sign any important legal documents or operate any machinery for 24 hours (because of the anesthesia used during the test).    NUTRITION  Drink plenty of fluids.   You may resume your  normal diet as instructed by your doctor.   Begin with a light meal and progress to your normal diet. Heavy or fried foods are harder to digest and may make you feel sick to your stomach (nauseated).   Avoid alcoholic beverages for 24 hours or as instructed.    MEDICATIONS  You may resume your normal medications.   WHAT YOU CAN EXPECT TODAY  Some feelings of bloating in the abdomen.   Passage of more gas than usual.   Spotting of blood in your stool or on the toilet paper  .  IF YOU HAD POLYPS REMOVED DURING THE ENDOSCOPY:  Eat a soft diet IF YOU HAVE NAUSEA, BLOATING, ABDOMINAL PAIN, OR VOMITING.    FINDING OUT THE RESULTS OF YOUR TEST Not all test results are available during your visit. DR. Darrick Penna WILL CALL YOU WITHIN 7 DAYS OF YOUR PROCEDUE WITH YOUR RESULTS. Do not assume everything is normal if you have not heard from DR. Katonya Blecher IN ONE WEEK, CALL HER OFFICE AT [phone].  SEEK IMMEDIATE MEDICAL ATTENTION AND CALL THE OFFICE: [phone] IF:  You have more than a spotting of blood in your stool.   Your belly is swollen (abdominal distention).   You are nauseated or vomiting.   You have a temperature over 101F.   You have abdominal pain or discomfort that is severe or gets worse throughout the day.    Gastritis  Gastritis is an inflammation (the body's way of reacting to injury and/or infection) of the stomach. It can also be  caused BY ASPIRIN, BC/GOODY POWDER'S, (IBUPROFEN) MOTRIN, OR ALEVE (NAPROXEN), chemicals (including alcohol), SPICY FOODS, and medications. This illness may be associated with generalized malaise (feeling tired, not well), UPPER ABDOMINAL STOMACH cramps, and fever. One common bacterial cause of gastritis is an organism known as H. Pylori. This can be treated with antibiotics.     Lactose Free Diet Lactose is a carbohydrate that is found mainly in milk and milk products, as well as in foods with added milk or whey. Lactose must be  digested by the enzyme in order to be used by the body. Lactose intolerance occurs when there is a shortage of lactase. When your body is not able to digest lactose, you may feel sick to your stomach (nausea), bloating, cramping, gas and diarrhea.  There are many dairy products that may be tolerated better than milk by some people:  The use of cultured dairy products such as yogurt, buttermilk, cottage cheese, and sweet acidophilus milk (Kefir) for lactase-deficient individuals is usually well tolerated. This is because the healthy bacteria help digest lactose.   Lactose-hydrolyzed milk (Lactaid) contains 40-90% less lactose than milk and may also be well tolerated.    SPECIAL NOTES  Lactose is a carbohydrates. The major food source is dairy products. Reading food labels is important. Many products contain lactose even when they are not made from milk. Look for the following words: whey, milk solids, dry milk solids, nonfat dry milk powder. Typical sources of lactose other than dairy products include breads, candies, cold cuts, prepared and processed foods, and commercial sauces and gravies.   All foods must be prepared without milk, cream, or other dairy foods.   Soy milk and lactose-free supplements (LACTASE) may be used as an alternative to milk.   FOOD GROUP ALLOWED/RECOMMENDED AVOID/USE SPARINGLY  BREADS / STARCHES 4 servings or more* Breads and rolls made without milk. [place], [place], or [place] bread. Breads and rolls that contain milk. Prepared mixes such as muffins, biscuits, waffles, pancakes. Sweet rolls, donuts, [place] toast (if made with milk or lactose).  Crackers: Soda crackers, graham crackers. Any crackers prepared without lactose. Zwieback crackers, corn curls, or any that contain lactose.  Cereals: Cooked or dry cereals prepared without lactose (read labels). Cooked or dry cereals prepared with lactose (read labels). Total, Cocoa Krispies. Special K.  Potatoes / Pasta /  Rice: Any prepared without milk or lactose. Popcorn. Instant potatoes, frozen [place] fries, scalloped or au gratin potatoes.  VEGETABLES 2 servings or more Fresh, frozen, and canned vegetables. Creamed or breaded vegetables. Vegetables in a cheese sauce or with lactose-containing margarines.  FRUIT 2 servings or more All fresh, canned, or frozen fruits that are not processed with lactose. Any canned or frozen fruits processed with lactose.  MEAT & SUBSTITUTES 2 servings or more (4 to 6 oz. total per day) Plain beef, chicken, fish, [place]key, lamb, veal, pork, or ham. Kosher prepared meat products. Strained or junior meats that do not contain milk. Eggs, soy meat substitutes, nuts. Scrambled eggs, omelets, and souffles that contain milk. Creamed or breaded meat, fish, or fowl. Sausage products such as wieners, liver sausage, or cold cuts that contain milk solids. Cheese, cottage cheese, or cheese spreads.  MILK None. (See BEVERAGES for milk substitutes. See DESSERTS for ice cream and frozen desserts.) Milk (whole, 2%, skim, or chocolate). Evaporated, powdered, or condensed milk; malted milk.  SOUPS & COMBINATION FOODS Bouillon, broth, vegetable soups, clear soups, consomms. Homemade soups made with allowed ingredients. Combination or prepared foods that  do not contain milk or milk products (read labels). Cream soups, chowders, commercially prepared soups containing lactose. Macaroni and cheese, pizza. Combination or prepared foods that contain milk or milk products.  DESSERTS & SWEETS In moderation Water and fruit ices; gelatin; angel food cake. Homemade cookies, pies, or cakes made from allowed ingredients. Pudding (if made with water or a milk substitute). Lactose-free tofu desserts. Sugar, honey, corn syrup, jam, jelly; marmalade; molasses (beet sugar); Pure sugar candy; marshmallows. Ice cream, ice milk, sherbet, custard, pudding, frozen yogurt. Commercial cake and cookie mixes. Desserts that contain  chocolate. Pie crust made with milk-containing margarine; reduced-calorie desserts made with a sugar substitute that contains lactose. Toffee, peppermint, butterscotch, chocolate, caramels.  FATS & OILS In moderation Butter (as tolerated; contains very small amounts of lactose). Margarines and dressings that do not contain milk, Vegetable oils, shortening, Miracle Whip, mayonnaise, nondairy cream & whipped toppings without lactose or milk solids added (examples: Coffee Rich, Carnation Coffeemate, Rich's Whipped Topping, PolyRich). Tomasa Blase. Margarines and salad dressings containing milk; cream, cream cheese; peanut butter with added milk solids, sour cream, chip dips, made with sour cream.  BEVERAGES Carbonated drinks; tea; coffee and freeze-dried coffee; some instant coffees (check labels). Fruit drinks; fruit and vegetable juice; Rice or Soy milk. Ovaltine, hot chocolate. Some cocoas; some instant coffees; instant iced teas; powdered fruit drinks (read labels).   CONDIMENTS / MISCELLANEOUS Soy sauce, carob powder, olives, gravy made with water, baker's cocoa, pickles, pure seasonings and spices, wine, pure monosodium glutamate, catsup, mustard. Some chewing gums, chocolate, some cocoas. Certain antibiotics and vitamin / mineral preparations. Spice blends if they contain milk products. MSG extender. Artificial sweeteners that contain lactose such as Equal (Nutra-Sweet) and Sweet 'n Low. Some nondairy creamers (read labels).   SAMPLE MENU*  Breakfast   Orange Juice.  Banana.   Bran flakes.   Nondairy Creamer.  [place]ienna Bread (toasted).   Butter or milk-free margarine.   Coffee or tea.    Noon Meal   Chicken Breast.  Rice.   Green beans.   Butter or milk-free margarine.  Fresh melon.   Coffee or tea.    Evening Meal   Roast Beef.  Baked potato.   Butter or milk-free margarine.   Broccoli.   Lettuce salad with vinegar and oil dressing.  [employer].   Coffee or  tea.      Low-Fat Diet BREADS, CEREALS, PASTA, RICE, DRIED PEAS, AND BEANS These products are high in carbohydrates and most are low in fat. Therefore, they can be increased in the diet as substitutes for fatty foods. They too, however, contain calories and should not be eaten in excess. Cereals can be eaten for snacks as well as for breakfast.  Include foods that contain fiber (fruits, vegetables, whole grains, and legumes). Research shows that fiber may lower blood cholesterol levels, especially the water-soluble fiber found in fruits, vegetables, oat products, and legumes. FRUITS AND VEGETABLES It is good to eat fruits and vegetables. Besides being sources of fiber, both are rich in vitamins and some minerals. They help you get the daily allowances of these nutrients. Fruits and vegetables can be used for snacks and desserts. MEATS Limit lean meat, chicken, [place], and fish to no more than 6 ounces per day. Beef, Pork, and Lamb Use lean cuts of beef, pork, and lamb. Lean cuts include:  Extra-lean ground beef.  Arm roast.  Sirloin tip.  Center-cut ham.  Round steak.  Loin chops.  Rump roast.  Tenderloin.  Trim all fat off the outside of meats before cooking. It is not necessary to severely decrease the intake of red meat, but lean choices should be made. Lean meat is rich in protein and contains a highly absorbable form of iron. Premenopausal women, in particular, should avoid reducing lean red meat because this could increase the risk for low red blood cells (iron-deficiency anemia). The organ meats, such as liver, sweetbreads, kidneys, and brain are very rich in cholesterol. They should be limited. Chicken and [place] These are good sources of protein. The fat of poultry can be reduced by removing the skin and underlying fat layers before cooking. Chicken and [place] can be substituted for lean red meat in the diet. Poultry should not be fried or covered with high-fat sauces. Fish and  Shellfish Fish is a good source of protein. Shellfish contain cholesterol, but they usually are low in saturated fatty acids. The preparation of fish is important. Like chicken and [place], they should not be fried or covered with high-fat sauces. EGGS Egg whites contain no fat or cholesterol. They can be eaten often. Try 1 to 2 egg whites instead of whole eggs in recipes or use egg substitutes that do not contain yolk. MILK AND DAIRY PRODUCTS Use skim or 1% milk instead of 2% or whole milk. Decrease whole milk, natural, and processed cheeses. Use nonfat or low-fat (2%) cottage cheese or low-fat cheeses made from vegetable oils. Choose nonfat or low-fat (1 to 2%) yogurt. Experiment with evaporated skim milk in recipes that call for heavy cream. Substitute low-fat yogurt or low-fat cottage cheese for sour cream in dips and salad dressings. Have at least 2 servings of low-fat dairy products, such as 2 glasses of skim (or 1%) milk each day to help get your daily calcium intake.  FATS AND OILS Reduce the total intake of fats, especially saturated fat. Butterfat, lard, and beef fats are high in saturated fat and cholesterol. These should be avoided as much as possible. Vegetable fats do not contain cholesterol, but certain vegetable fats, such as coconut oil, palm oil, and palm kernel oil are very high in saturated fats. These should be limited. These fats are often used in bakery goods, processed foods, popcorn, oils, and nondairy creamers. Vegetable shortenings and some peanut butters contain hydrogenated oils, which are also saturated fats. Read the labels on these foods and check for saturated vegetable oils. Unsaturated vegetable oils and fats do not raise blood cholesterol. However, they should be limited because they are fats and are high in calories. Total fat should still be limited to 30% of your daily caloric intake. Desirable liquid vegetable oils are corn oil, cottonseed oil, olive oil, canola oil,  safflower oil, soybean oil, and sunflower oil. Peanut oil is not as good, but small amounts are acceptable. Buy a heart-healthy tub margarine that has no partially hydrogenated oils in the ingredients. Mayonnaise and salad dressings often are made from unsaturated fats, but they should also be limited because of their high calorie and fat content. Seeds, nuts, peanut butter, olives, and avocados are high in fat, but the fat is mainly the unsaturated type. These foods should be limited mainly to avoid excess calories and fat. OTHER EATING TIPS Snacks  Most sweets should be limited as snacks. They tend to be rich in calories and fats, and their caloric content outweighs their nutritional value. Some good choices in snacks are graham crackers, melba toast, soda crackers, bagels (no egg), English muffins, fruits, and vegetables. These  snacks are preferable to snack crackers, [place] fries, and chips. Popcorn should be air-popped or cooked in small amounts of liquid vegetable oil. Desserts Eat fruit, low-fat yogurt, and fruit ices. AVOID pastries, cake, and cookies. Sherbet, angel food cake, gelatin dessert, frozen low-fat yogurt, or other frozen products that do not contain saturated fat (pure fruit juice bars, frozen ice pops) are also acceptable.  COOKING METHODS Choose those methods that use little or no fat. They include: Poaching.  Braising.  Steaming.  Grilling.  Baking.  Stir-frying.  Broiling.  Microwaving.  Foods can be cooked in a nonstick pan without added fat, or use a nonfat cooking spray in regular cookware. Limit fried foods and avoid frying in saturated fat. Add moisture to lean meats by using water, broth, cooking wines, and other nonfat or low-fat sauces along with the cooking methods mentioned above. Soups and stews should be chilled after cooking. The fat that forms on top after a few hours in the refrigerator should be skimmed off. When preparing meals, avoid using excess salt. Salt  can contribute to raising blood pressure in some people. EATING AWAY FROM HOME Order entres, potatoes, and vegetables without sauces or butter. When meat exceeds the size of a deck of cards (3 to 4 ounces), the rest can be taken home for another meal. Choose vegetable or fruit salads and ask for low-calorie salad dressings to be served on the side. Use dressings sparingly. Limit high-fat toppings, such as bacon, crumbled eggs, cheese, sunflower seeds, and olives. Ask for heart-healthy tub margarine instead of butter.  Hemorrhoids Hemorrhoids are dilated (enlarged) veins around the rectum. Sometimes clots will form in the veins. This makes them swollen and painful. These are called thrombosed hemorrhoids. Causes of hemorrhoids include:  Constipation.   Straining to have a bowel movement.   HEAVY LIFTING HOME CARE INSTRUCTIONS  Eat a well balanced diet and drink 6 to 8 glasses of water every day to avoid constipation. You may also use a bulk laxative.   Avoid straining to have bowel movements.   Keep anal area dry and clean.   Do not use a donut shaped pillow or sit on the toilet for long periods. This increases blood pooling and pain.   Move your bowels when your body has the urge; this will require less straining and will decrease pain and pressure.

## 2014-05-31 NOTE — Transfer of Care (Signed)
Immediate Anesthesia Transfer of Care Note  Patient: Kristin [name]  Procedure(s) Performed: Procedure(s) with comments: COLONOSCOPY WITH PROPOFOL (N/A) - in cecum at 0915, out of cecum at 0931 = total time 16 minutes ESOPHAGOGASTRODUODENOSCOPY (EGD) WITH PROPOFOL (N/A) BIOPSY (N/A)  Patient Location: PACU  Anesthesia Type:MAC  Level of Consciousness: awake, alert , oriented and patient cooperative  Airway & Oxygen Therapy: Patient Spontanous Breathing and Patient connected to face mask oxygen  Post-op Assessment: Report given to PACU RN and Post -op Vital signs reviewed and stable  Post vital signs: stable  Complications: No apparent anesthesia complications 

## 2014-05-31 NOTE — Transfer of Care (Signed)
Immediate Anesthesia Transfer of Care Note  Patient: Kristin MaltaJessica M Lehtinen  Procedure(s) Performed: Procedure(s) with comments: COLONOSCOPY WITH PROPOFOL (N/A) - in cecum at 0915, out of cecum at 0931 = total time 16 minutes ESOPHAGOGASTRODUODENOSCOPY (EGD) WITH PROPOFOL (N/A) BIOPSY (N/A)  Patient Location: PACU  Anesthesia Type:MAC  Level of Consciousness: awake, alert , oriented and patient cooperative  Airway & Oxygen Therapy: Patient Spontanous Breathing and Patient connected to face mask oxygen  Post-op Assessment: Report given to PACU RN and Post -op Vital signs reviewed and stable  Post vital signs: stable  Complications: No apparent anesthesia complications
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
## 2014-05-31 NOTE — Telephone Encounter (Signed)
[facility] [place]

## 2014-05-31 NOTE — Anesthesia Postprocedure Evaluation (Signed)
  Anesthesia Post-op Note  Patient: Kristin MaltaJessica M Jobe  Procedure(s) Performed: Procedure(s) with comments: COLONOSCOPY WITH PROPOFOL (N/A) - in cecum at 0915, out of cecum at 0931 = total time 16 minutes ESOPHAGOGASTRODUODENOSCOPY (EGD) WITH PROPOFOL (N/A) BIOPSY (N/A)  Patient Location: PACU  Anesthesia Type:MAC  Level of Consciousness: awake, alert , oriented and patient cooperative  Airway and Oxygen Therapy: Patient Spontanous Breathing and Patient connected to face mask oxygen  Post-op Pain: none  Post-op Assessment: Post-op Vital signs reviewed  Post-op Vital Signs: stable  Last Vitals:  Filed Vitals:   05/31/14 1002  BP: 149/121  Pulse: 105  Temp: 36.9 C  Resp: 20    Complications: No apparent anesthesia complications

## 2014-05-31 NOTE — Interval H&P Note (Signed)
History and Physical Interval Note:  05/31/2014 8:17 AM  Kristin [name]  has presented today for surgery, with the diagnosis of HEMATOCHEZIA  AND HEMATEMESIS WITH NAUSEA  The various methods of treatment have been discussed with the patient and family. After consideration of risks, benefits and other options for treatment, the patient has consented to  Procedure(s) with comments: COLONOSCOPY WITH PROPOFOL (N/A) - 8:45 ESOPHAGOGASTRODUODENOSCOPY (EGD) WITH PROPOFOL (N/A) - 8:45 as a surgical intervention .  The patient's history has been reviewed, patient examined, no change in status, stable for surgery.  I have reviewed the patient's chart and labs.  Questions were answered to the patient's satisfaction.     Eaton CorporationSandi Channing Yeager

## 2014-05-31 NOTE — Op Note (Signed)
[facility] [address] [place], [id number]   ENDOSCOPY PROCEDURE REPORT  PATIENT: [name], Kristin MaltaJessica M.  MR#: [id number] BIRTHDATE: Oct 30, 1981 , 32  yrs. old GENDER: Female  ENDOSCOPIST: Jonette EvaSandi Tayshawn Purnell, MD REFERRED XB:JYNWGBY:Larry Sherwood GamblerFusco, M.D. PROCEDURE DATE: 05/31/2014 PROCEDURE:   EGD w/ biopsy  INDICATIONS:Nausea. MEDICATIONS: MAC sedation, administered by CRNA TOPICAL ANESTHETIC:   Cetacaine Spray DESCRIPTION OF PROCEDURE:     Physical exam was performed.  Informed consent was obtained from the patient after explaining the benefits, risks, and alternatives to the procedure.  The patient was connected to the monitor and placed in the left lateral position.  Continuous oxygen was provided by nasal cannula and IV medicine administered through an indwelling cannula.  After administration of sedation, the patients esophagus was intubated and the EG-[id number] ([id number])  endoscope was advanced under direct visualization to the second portion of the duodenum.  The scope was removed slowly by carefully examining the color, texture, anatomy, and integrity of the mucosa on the way out.  The patient was recovered in endoscopy and discharged home in satisfactory condition.   ESOPHAGUS: The mucosa of the esophagus appeared normal.   A small hiatal hernia was noted.   STOMACH: Moderate non-erosive gastritis (inflammation) was found in the entire examined stomach.  Multiple biopsies were performed using cold forceps.   DUODENUM: The duodenal mucosa showed no abnormalities in the bulb and second portion of the duodenum.  Cold forcep biopsies were taken in the BULB AND second portion. COMPLICATIONS:   None  ENDOSCOPIC IMPRESSION: 1.   NAUSEA MOST LIKELY DUE TO GERD/GASTRITIS/IBS-D. NO OBVIOUS SOURCE FOR DIARRHEA IDENTIFIED. 2.   Small hiatal hernia   RECOMMENDATIONS: ADD DICYCLOMINE 10 MG TABLETS ONE OR TWO 30 MINUTES PRIOR MEALS UP TO THREE TIMES A DAY AND AT BEDTIME. AVOID  ASPIRIN, BC/GOODY POWDERS, IBUPROFEN/MOTRIN, OR NAPROXEN/ALEVE FOR 2 WEEKS due to GASTRITIS. TAKE OMEPRAZOLE 30 MINUTES PRIOR TO MEALS TWICE DAILY FOR 3 MOS THE ONCE DAILY. AVOID ITEMS THAT TRIGGER REFLUX.  FOLLOW A DIABETIC/LOW FAT/DAIRY FREE DIET.  AVOID ITEMS THAT CAUSE BLOATING.  BIOPSY RESULTS WILL BE BACK IN 7 DAYS. FOLLOW UP IN 3 MOS. CONSIDER GES IF NAUSEA PERSISTS.   REPEAT EXAM:   _______________________________ Rosalie DoctoreSignedJonette Eva:  Camyra Vaeth, MD 05/31/2014 11:21 AM

## 2014-05-31 NOTE — Telephone Encounter (Signed)
Pt called and said she was given a prescription for Tramadol and she cannot take that. She said she has told every doctor that she sees and they keep giving it to her. She is requesting something else for pain. Please advise!

## 2014-05-31 NOTE — Addendum Note (Signed)
Addendum created 05/31/14 1400 by Moshe SalisburyKaren E Kyel Purk, CRNA   Modules edited: Anesthesia Flowsheet, Charges VN

## 2014-05-31 NOTE — Telephone Encounter (Signed)
PT NEEDS A REFERRAL FOR A MENTAL HEALTH SPECIALIST, Dx: ANXIETY, DEPRESSION.

## 2014-06-01 ENCOUNTER — Encounter (HOSPITAL_COMMUNITY): Payer: Self-pay | Admitting: Gastroenterology

## 2014-06-01 ENCOUNTER — Other Ambulatory Visit: Payer: Self-pay | Admitting: Gastroenterology

## 2014-06-01 DIAGNOSIS — R109 Unspecified abdominal pain: Secondary | ICD-10-CM

## 2014-06-01 DIAGNOSIS — R52 Pain, unspecified: Secondary | ICD-10-CM

## 2014-06-01 DIAGNOSIS — K589 Irritable bowel syndrome without diarrhea: Secondary | ICD-10-CM

## 2014-06-01 NOTE — Telephone Encounter (Signed)
REVIEWED. PLEASE CALL PT. EXPLAIN TO HER WE DO NOT PRESCRIBE PAIN MEDS FOR IBS. SHE WILL NEED TO SEE A PAIN MANAGEMENT SPECIALIST. REFER TO PAIN MANAGEMENT FOR CHRONIC ABDOMINAL PAIN/IBS.

## 2014-06-01 NOTE — Telephone Encounter (Signed)
LMOM to call.

## 2014-06-01 NOTE — Progress Notes (Signed)
OPEN IN ERROR 

## 2014-06-01 NOTE — Telephone Encounter (Signed)
Pt is aware that referral will be made to Pain Management.

## 2014-06-01 NOTE — Telephone Encounter (Signed)
Referral made to Dr. Gerilyn Pilgrimoonquah for pain management

## 2014-06-03 NOTE — Op Note (Addendum)
[facility] [address] [place], [id number]   COLONOSCOPY PROCEDURE REPORT  PATIENT: Kristin [name], Kristin MaltaJessica M.  MR#: [id number] BIRTHDATE: Aug 17, 1981 , 32  yrs. old GENDER: Female ENDOSCOPIST: Jonette EvaSandi Laurren Lepkowski, MD REFERRED XB:JYNWGBY:Larry Sherwood GamblerFusco, TuckerD. PROCEDURE DATE:  05/31/2014 PROCEDURE:   Colonoscopy with biopsy INDICATIONS:chronic diarrhea, abdominal pain in the lower left quadrant, BRBP, and Nausea.  PMHx: DM/IBS-UNDER A LOT OF STRESS MEDICATIONS: MAC sedation, administered by CRNA  DESCRIPTION OF PROCEDURE:    Physical exam was performed.  Informed consent was obtained from the patient after explaining the benefits, risks, and alternatives to procedure.  The patient was connected to monitor and placed in left lateral position. Continuous oxygen was provided by nasal cannula and IV medicine administered through an indwelling cannula.  After administration of sedation and rectal exam, the patients rectum was intubated and the EC-3890Li ([id number]) and EG-2990i ([id number])  colonoscope was advanced under direct visualization to the ileum.  The scope was removed slowly by carefully examining the color, texture, anatomy, and integrity mucosa on the way out.  The patient was recovered in endoscopy and discharged home in satisfactory condition.    COLON FINDINGS: The mucosa appeared normal in the terminal ileum.  , A normal appearing cecum, ileocecal valve, and appendiceal orifice were identified.  The ascending, hepatic flexure, transverse, splenic flexure, descending, sigmoid colon and rectum appeared unremarkable.  No polyps or cancers were seen.  Multiple COLD biopsies were performed IN THE RIGHT AND LEFT COLON. Moderate sized internal hemorrhoids were found.  PREP QUALITY: good.  CECAL W/D TIME: 17 minutes     COMPLICATIONS: None  ENDOSCOPIC IMPRESSION: 1.   NO OBVIOUS SOURCE FOR NAUSEA, DIARRHEA. OR LLQ ABD PAIN IDENTIFIED. 2.   Normal colon 3.   INTERMITTENT RECTAL  BLEEDING DUE TO Moderate sized internal hemorrhoids   RECOMMENDATIONS: ADD DICYCLOMINE 10 MG TABLETS ONE OR TWO 30 MINUTES PRIOR MEALS UP TO THREE TIMES A DAY AND AT BEDTIME. AVOID ASPIRIN, BC/GOODY POWDERS, IBUPROFEN/MOTRIN, OR NAPROXEN/ALEVE FOR 2 WEEKS due to GASTRITIS. TAKE OMEPRAZOLE 30 MINUTES PRIOR TO MEALS TWICE DAILY FOR 3 MOS THE ONCE DAILY. AVOID ITEMS THAT TRIGGER REFLUX.  FOLLOW A DIABETIC/LOW FAT/DAIRY FREE DIET.  AVOID ITEMS THAT CAUSE BLOATING.  BIOPSY RESULTS WILL BE BACK IN 7 DAYS. FOLLOW UP IN 3 MOS.     _______________________________ Rosalie DoctoreSignedJonette Eva:  Jeffery Gammell, MD 05/31/2014 11:28 AM

## 2014-06-07 ENCOUNTER — Telehealth: Payer: Self-pay | Admitting: Gastroenterology

## 2014-06-07 NOTE — Telephone Encounter (Signed)
Pt called asking if her results from her procedure were back yet. Please call [phone]

## 2014-06-08 ENCOUNTER — Encounter: Payer: Self-pay | Admitting: Gastroenterology

## 2014-06-08 ENCOUNTER — Telehealth: Payer: Self-pay | Admitting: Gastroenterology

## 2014-06-08 NOTE — Telephone Encounter (Signed)
I returned call and LMOM that I do not have the results yet.

## 2014-06-08 NOTE — Telephone Encounter (Signed)
Please call pt. Her colon and small bowel biopsies are normal. HER stomach Bx shows gastritis. HER RESULTS ARE NO DIFFERENT THAN THEY WERE IN OCT 2013. HER SX ARE MOST LIKELY DUE TO IBS.   SHE SHOULD CONSIDER SEEING A MENTAL HEALTH SPECIALIST TO MANAGE HER STRESS.   USE DICYCLOMINE 10 MG TABLETS ONE OR TWO 30 MINUTES PRIOR MEALS UP TO THREE TIMES A DAY AND AT BEDTIME.  MAY USE ASPIRIN, BC/GOODY POWDERS, IBUPROFEN/MOTRIN, OR NAPROXEN/ALEVE.   TAKE OMEPRAZOLE 30 MINUTES PRIOR TO MEALS TWICE DAILY FOR 3 MOS THE ONCE DAILY.  AVOID ITEMS THAT TRIGGER REFLUX.   FOLLOW A DIABETIC/LOW FAT/DAIRY FREE DIET. AVOID ITEMS THAT CAUSE BLOATING.   CALL IN ONE MONTH IF NAUSEA CONTINUES.  FOLLOW UP IN 3 MOS E30 NAUSEA/DIARRHEA/ABDOMINAL PAIN.

## 2014-06-08 NOTE — Telephone Encounter (Signed)
SEE PHONE NOTE JUL 29.

## 2014-06-09 ENCOUNTER — Telehealth: Payer: Self-pay | Admitting: *Deleted

## 2014-06-09 NOTE — Telephone Encounter (Signed)
LMOM to call.

## 2014-06-09 NOTE — Telephone Encounter (Signed)
Reminder in epic °

## 2014-06-09 NOTE — Telephone Encounter (Signed)
Pt called wanting to get her results. Please advise  

## 2014-06-09 NOTE — Telephone Encounter (Signed)
LMOM for return call

## 2014-06-10 NOTE — Telephone Encounter (Signed)
Pt is aware of results. 

## 2014-06-10 NOTE — Telephone Encounter (Signed)
Pt returned call and was informed.  

## 2014-06-23 ENCOUNTER — Telehealth: Payer: Self-pay

## 2014-06-23 NOTE — Telephone Encounter (Signed)
Dr.Ross from behavioral health said that [facility].

## 2014-06-23 NOTE — Telephone Encounter (Signed)
I have LMOM for patient to return my call.  

## 2014-06-23 NOTE — Telephone Encounter (Signed)
Routing to Dr. Darrick PennaFields and Soledad GerlachLeigh Ann.

## 2014-06-23 NOTE — Telephone Encounter (Signed)
Appointment at [facility] [place] [address] Ph [phone] has been made on Tuesday August 18th walk in from 11:00 to 2:45 anytime durning that time 24 hr Line [phone]

## 2014-07-13 ENCOUNTER — Emergency Department (HOSPITAL_COMMUNITY): Payer: Self-pay

## 2014-07-13 ENCOUNTER — Emergency Department (HOSPITAL_COMMUNITY)
Admission: EM | Admit: 2014-07-13 | Discharge: 2014-07-13 | Disposition: A | Discharge status: Home / Self Care | Payer: Self-pay | Attending: Emergency Medicine | Admitting: Emergency Medicine

## 2014-07-13 ENCOUNTER — Encounter (HOSPITAL_COMMUNITY): Payer: Self-pay | Admitting: Emergency Medicine

## 2014-07-13 DIAGNOSIS — Z8719 Personal history of other diseases of the digestive system: Secondary | ICD-10-CM | POA: Insufficient documentation

## 2014-07-13 DIAGNOSIS — J45909 Unspecified asthma, uncomplicated: Secondary | ICD-10-CM | POA: Insufficient documentation

## 2014-07-13 DIAGNOSIS — R569 Unspecified convulsions: Secondary | ICD-10-CM | POA: Insufficient documentation

## 2014-07-13 DIAGNOSIS — E109 Type 1 diabetes mellitus without complications: Secondary | ICD-10-CM | POA: Insufficient documentation

## 2014-07-13 DIAGNOSIS — Z794 Long term (current) use of insulin: Secondary | ICD-10-CM | POA: Insufficient documentation

## 2014-07-13 DIAGNOSIS — F17200 Nicotine dependence, unspecified, uncomplicated: Secondary | ICD-10-CM | POA: Insufficient documentation

## 2014-07-13 DIAGNOSIS — Z79899 Other long term (current) drug therapy: Secondary | ICD-10-CM | POA: Insufficient documentation

## 2014-07-13 LAB — CBG MONITORING, ED
GLUCOSE-CAPILLARY: 119 mg/dL — AB (ref 70–99)
Glucose-Capillary: 68 mg/dL — ABNORMAL LOW (ref 70–99)

## 2014-07-13 LAB — CBC
HCT: 40.4 % (ref 36.0–46.0)
Hemoglobin: 13.8 g/dL (ref 12.0–15.0)
MCH: 31.2 pg (ref 26.0–34.0)
MCHC: 34.2 g/dL (ref 30.0–36.0)
MCV: 91.4 fL (ref 78.0–100.0)
PLATELETS: 271 10*3/uL (ref 150–400)
RBC: 4.42 MIL/uL (ref 3.87–5.11)
RDW: 14.3 % (ref 11.5–15.5)
WBC: 9.4 10*3/uL (ref 4.0–10.5)

## 2014-07-13 LAB — BASIC METABOLIC PANEL
Anion gap: 10 (ref 5–15)
BUN: 10 mg/dL (ref 6–23)
CHLORIDE: 103 meq/L (ref 96–112)
CO2: 28 mEq/L (ref 19–32)
Calcium: 9.2 mg/dL (ref 8.4–10.5)
Creatinine, Ser: 0.61 mg/dL (ref 0.50–1.10)
GFR calc Af Amer: >90 mL/min (ref >90)
GLUCOSE: 86 mg/dL (ref 70–99)
Potassium: 4.4 mEq/L (ref 3.7–5.3)
SODIUM: 141 meq/L (ref 137–147)

## 2014-07-13 LAB — HEPATIC FUNCTION PANEL
ALK PHOS: 63 U/L (ref 39–117)
ALT: 7 U/L (ref 0–35)
AST: 14 U/L (ref 0–37)
Albumin: 3.5 g/dL (ref 3.5–5.2)
Bilirubin, Direct: <0.2 mg/dL (ref 0.0–0.3)
TOTAL PROTEIN: 6.7 g/dL (ref 6.0–8.3)
Total Bilirubin: 0.4 mg/dL (ref 0.3–1.2)

## 2014-07-13 MED ORDER — LORAZEPAM 1 MG PO TABS: 1.0000 mg | ORAL_TABLET | Freq: Two times a day (BID) | ORAL | Status: DC

## 2014-07-13 MED ORDER — LORAZEPAM 2 MG/ML IJ SOLN
1.0000 mg | Freq: Once | INTRAMUSCULAR | Status: AC
  Administered 2014-07-13: 1 mg via INTRAVENOUS
  Filled 2014-07-13: qty 1
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
## 2014-07-13 NOTE — Discharge Instructions (Signed)
Follow up with your family md in one week.  No driving

## 2014-07-13 NOTE — ED Notes (Signed)
Patient transported to CT 

## 2014-07-13 NOTE — ED Notes (Signed)
Pt given PO orange juice and tolerating well.

## 2014-07-13 NOTE — Care Management Note (Signed)
ED/CM noted patient did not have health insurance and/or PCP listed in the computer.  Patient was given the [place] resource handout with information on the clinics, food pantries, and the handout for new health insurance sign-up.  Patient expressed appreciation for information received. Pt was also given a Rx discount card.   

## 2014-07-13 NOTE — ED Notes (Signed)
Pt reports "Doctor reported would prescribe pain med at d/c." EDP that was seeing pt has completed his shift and is no longer in department. Consulted other EDP concerning pt report, no new orders given. Pt informed that EDP that saw her is no longer available. Pt pain that she is c/o is "chronic with intermittent flare-ups." Pt and pt family verbalized understanding that no pain med px was going to be provided today.

## 2014-07-14 ENCOUNTER — Encounter: Payer: Self-pay | Admitting: Gastroenterology

## 2014-07-14 NOTE — ED Provider Notes (Signed)
CSN: [id number]     Arrival date & time 07/13/14  1300 History   First MD Initiated Contact with Patient 07/13/14 1330     Chief Complaint  Patient presents with  . Seizures     (Consider location/radiation/quality/duration/timing/severity/associated sxs/prior Treatment) Patient is a 33 y.o. female presenting with seizures. The history is provided by the patient (the pt had a sz today.  pt has a hx of sz).  Seizures Seizure activity on arrival: yes   Seizure type:  Grand mal Preceding symptoms: no sensation of an aura present   Initial focality:  None Episode characteristics: abnormal movements   Postictal symptoms: confusion   Return to baseline: yes     Past Medical History  Diagnosis Date  . Diabetes mellitus type 1   . Asthma   . IBS (irritable bowel syndrome)    Past Surgical History  Procedure Laterality Date  . Appendectomy    . Knee surgery      left  . Tubal ligation    . Wrist surgery      left  . Colonoscopy  08/24/2012    NL COLON Bx  . Esophagogastroduodenoscopy  08/24/2012    NL DUO Bx  . Colonoscopy with propofol N/A 05/31/2014    NL COLON Bx  . Esophagogastroduodenoscopy (egd) with propofol N/A 05/31/2014    GASTRITIS, NL DUO Bx  . Esophageal biopsy N/A 05/31/2014    GASTRIC BX NOT ESOPHAGEAL   Family History  Problem Relation Age of Onset  . Colon cancer Neg Hx   . Diabetes Other   . Stroke Other    History  Substance Use Topics  . Smoking status: Current Every Day Smoker -- 1.50 packs/day for 12 years    Types: Cigarettes  . Smokeless tobacco: Never Used  . Alcohol Use: No   OB History   Grav Para Term Preterm Abortions TAB SAB Ect Mult Living   Review of Systems  Constitutional: Negative for appetite change and fatigue.  HENT: Negative for congestion, ear discharge and sinus pressure.   Eyes: Negative for discharge.  Respiratory: Negative for cough.   Cardiovascular: Negative for chest pain.  Gastrointestinal:  Negative for abdominal pain and diarrhea.  Genitourinary: Negative for frequency and hematuria.  Musculoskeletal: Negative for back pain.  Skin: Negative for rash.  Neurological: Positive for seizures. Negative for headaches.  Psychiatric/Behavioral: Negative for hallucinations.      Allergies  Sulfa antibiotics  Home Medications   Prior to Admission medications   Medication Sig Start Date End Date Taking? Authorizing Provider  dicyclomine (BENTYL) 10 MG capsule Take 10 mg by mouth 2 (two) times daily.   Yes Historical Provider, MD  insulin lispro (HUMALOG) 100 UNIT/ML injection Inject 1-8 Units into the skin 3 (three) times daily before meals. Sliding Scale.   Yes Historical Provider, MD  insulin lispro protamine-insulin lispro (HUMALOG 75/25) (75-25) 100 UNIT/ML SUSP Inject 14 Units into the skin 2 (two) times daily with a meal.    Yes Historical Provider, MD  LORazepam (ATIVAN) 1 MG tablet Take 1 mg by mouth 2 (two) times daily.   Yes Historical Provider, MD  omeprazole (PRILOSEC) 20 MG capsule Take 20 mg by mouth daily.   Yes Historical Provider, MD  QUEtiapine (SEROQUEL) 100 MG tablet Take 300 mg by mouth at bedtime.    Yes Historical Provider, MD  LORazepam (ATIVAN) 1 MG tablet Take 1 tablet (1  mg total) by mouth 2 (two) times daily. 07/13/14   Benny Lennert, MD   BP 103/79  Pulse 90  Resp 11  Ht  (1.575 m)  Wt 113 lb (51.256 kg)  BMI 20.66 kg/m2  SpO2 97%  LMP 07/10/2014 Physical Exam  Constitutional: She is oriented to person, place, and time. She appears well-developed.  HENT:  Head: Normocephalic.  Eyes: Conjunctivae and EOM are normal. No scleral icterus.  Neck: Neck supple. No thyromegaly present.  Cardiovascular: Normal rate and regular rhythm.  Exam reveals no gallop and no friction rub.   No murmur heard. Pulmonary/Chest: No stridor. She has no wheezes. She has no rales. She exhibits no tenderness.  Abdominal: She exhibits no distension. There is no  tenderness. There is no rebound.  Musculoskeletal: Normal range of motion. She exhibits no edema.  Lymphadenopathy:    She has no cervical adenopathy.  Neurological: She is oriented to person, place, and time. She exhibits normal muscle tone. Coordination normal.  Skin: No rash noted. No erythema.  Psychiatric: She has a normal mood and affect. Her behavior is normal.    ED Course  Procedures (including critical care time) Labs Review Labs Reviewed  CBG MONITORING, ED - Abnormal; Notable for the following:    Glucose-Capillary 68 (*)    All other components within normal limits  CBG MONITORING, ED - Abnormal; Notable for the following:    Glucose-Capillary 119 (*)    All other components within normal limits  BASIC METABOLIC PANEL  CBC  HEPATIC FUNCTION PANEL  CBG MONITORING, ED    Imaging Review Ct Head Wo Contrast  07/13/2014   CLINICAL DATA:  Seizure and fall.  EXAM: CT HEAD WITHOUT CONTRAST  CT MAXILLOFACIAL WITHOUT CONTRAST  CT CERVICAL SPINE WITHOUT CONTRAST  TECHNIQUE: Multidetector CT imaging of the head, cervical spine, and maxillofacial structures were performed using the standard protocol without intravenous contrast. Multiplanar CT image reconstructions of the cervical spine and maxillofacial structures were also generated.  COMPARISON:  None.  FINDINGS: CT HEAD FINDINGS  The brain appears normal without hemorrhage, infarct, mass lesion, mass effect, midline shift or abnormal extra axial fluid collection. There is no hydrocephalus or pneumocephalus. The calvarium is intact. Mucous retention cyst or polyp in the sphenoid sinus is noted.  CT MAXILLOFACIAL FINDINGS  Contusion is seen about the right eye. The globes are intact and the lenses are located. No facial bone fracture is identified. The mandibular condyles are located.  CT CERVICAL SPINE FINDINGS  Vertebral body and height and alignment are maintained. Intervertebral disc space is normal. The lung apices are clear. 0.9 cm  low attenuating lesion left lobe of thyroid is incidentally noted.  IMPRESSION: Soft tissue contusion about the right eye without facial bone fracture.  Negative head and cervical spine CT scans.   Electronically Signed   By: Drusilla Kanner M.D.   On: 07/13/2014 14:54   Ct Cervical Spine Wo Contrast  07/13/2014   CLINICAL DATA:  Seizure and fall.  EXAM: CT HEAD WITHOUT CONTRAST  CT MAXILLOFACIAL WITHOUT CONTRAST  CT CERVICAL SPINE WITHOUT CONTRAST  TECHNIQUE: Multidetector CT imaging of the head, cervical spine, and maxillofacial structures were performed using the standard protocol without intravenous contrast. Multiplanar CT image reconstructions of the cervical spine and maxillofacial structures were also generated.  COMPARISON:  None.  FINDINGS: CT HEAD FINDINGS  The brain appears normal without hemorrhage, infarct, mass lesion, mass effect, midline shift or abnormal extra axial fluid collection. There is  no hydrocephalus or pneumocephalus. The calvarium is intact. Mucous retention cyst or polyp in the sphenoid sinus is noted.  CT MAXILLOFACIAL FINDINGS  Contusion is seen about the right eye. The globes are intact and the lenses are located. No facial bone fracture is identified. The mandibular condyles are located.  CT CERVICAL SPINE FINDINGS  Vertebral body and height and alignment are maintained. Intervertebral disc space is normal. The lung apices are clear. 0.9 cm low attenuating lesion left lobe of thyroid is incidentally noted.  IMPRESSION: Soft tissue contusion about the right eye without facial bone fracture.  Negative head and cervical spine CT scans.   Electronically Signed   By: Drusilla Kanner M.D.   On: 07/13/2014 14:54   Ct Maxillofacial Wo Cm  07/13/2014   CLINICAL DATA:  Seizure and fall.  EXAM: CT HEAD WITHOUT CONTRAST  CT MAXILLOFACIAL WITHOUT CONTRAST  CT CERVICAL SPINE WITHOUT CONTRAST  TECHNIQUE: Multidetector CT imaging of the head, cervical spine, and maxillofacial structures were  performed using the standard protocol without intravenous contrast. Multiplanar CT image reconstructions of the cervical spine and maxillofacial structures were also generated.  COMPARISON:  None.  FINDINGS: CT HEAD FINDINGS  The brain appears normal without hemorrhage, infarct, mass lesion, mass effect, midline shift or abnormal extra axial fluid collection. There is no hydrocephalus or pneumocephalus. The calvarium is intact. Mucous retention cyst or polyp in the sphenoid sinus is noted.  CT MAXILLOFACIAL FINDINGS  Contusion is seen about the right eye. The globes are intact and the lenses are located. No facial bone fracture is identified. The mandibular condyles are located.  CT CERVICAL SPINE FINDINGS  Vertebral body and height and alignment are maintained. Intervertebral disc space is normal. The lung apices are clear. 0.9 cm low attenuating lesion left lobe of thyroid is incidentally noted.  IMPRESSION: Soft tissue contusion about the right eye without facial bone fracture.  Negative head and cervical spine CT scans.   Electronically Signed   By: Drusilla Kanner M.D.   On: 07/13/2014 14:54     EKG Interpretation None      MDM   Final diagnoses:  Seizure    sz   Pt to follow up with pcp    Benny Lennert, MD 07/14/14 1547

## 2014-09-12 ENCOUNTER — Encounter (HOSPITAL_COMMUNITY): Payer: Self-pay | Admitting: Emergency Medicine

## 2014-09-16 ENCOUNTER — Inpatient Hospital Stay (HOSPITAL_COMMUNITY)
Admission: EM | Admit: 2014-09-16 | Discharge: 2014-09-18 | DRG: 639 | Disposition: A | Discharge status: Home / Self Care | Payer: Self-pay | Attending: Internal Medicine | Admitting: Internal Medicine

## 2014-09-16 ENCOUNTER — Encounter (HOSPITAL_COMMUNITY): Payer: Self-pay | Admitting: Emergency Medicine

## 2014-09-16 ENCOUNTER — Emergency Department (HOSPITAL_COMMUNITY): Payer: Self-pay

## 2014-09-16 DIAGNOSIS — K920 Hematemesis: Secondary | ICD-10-CM | POA: Diagnosis present

## 2014-09-16 DIAGNOSIS — K589 Irritable bowel syndrome without diarrhea: Secondary | ICD-10-CM

## 2014-09-16 DIAGNOSIS — E1110 Type 2 diabetes mellitus with ketoacidosis without coma: Secondary | ICD-10-CM | POA: Diagnosis present

## 2014-09-16 DIAGNOSIS — K219 Gastro-esophageal reflux disease without esophagitis: Secondary | ICD-10-CM | POA: Diagnosis present

## 2014-09-16 DIAGNOSIS — E1010 Type 1 diabetes mellitus with ketoacidosis without coma: Principal | ICD-10-CM | POA: Diagnosis present

## 2014-09-16 DIAGNOSIS — Z794 Long term (current) use of insulin: Secondary | ICD-10-CM

## 2014-09-16 DIAGNOSIS — F17210 Nicotine dependence, cigarettes, uncomplicated: Secondary | ICD-10-CM | POA: Diagnosis present

## 2014-09-16 DIAGNOSIS — G40909 Epilepsy, unspecified, not intractable, without status epilepticus: Secondary | ICD-10-CM | POA: Diagnosis present

## 2014-09-16 DIAGNOSIS — Z9049 Acquired absence of other specified parts of digestive tract: Secondary | ICD-10-CM | POA: Diagnosis present

## 2014-09-16 DIAGNOSIS — J45909 Unspecified asthma, uncomplicated: Secondary | ICD-10-CM | POA: Diagnosis present

## 2014-09-16 DIAGNOSIS — R1032 Left lower quadrant pain: Secondary | ICD-10-CM

## 2014-09-16 DIAGNOSIS — R112 Nausea with vomiting, unspecified: Secondary | ICD-10-CM | POA: Diagnosis present

## 2014-09-16 DIAGNOSIS — Z823 Family history of stroke: Secondary | ICD-10-CM

## 2014-09-16 DIAGNOSIS — R109 Unspecified abdominal pain: Secondary | ICD-10-CM

## 2014-09-16 DIAGNOSIS — Z833 Family history of diabetes mellitus: Secondary | ICD-10-CM

## 2014-09-16 DIAGNOSIS — K529 Noninfective gastroenteritis and colitis, unspecified: Secondary | ICD-10-CM

## 2014-09-16 DIAGNOSIS — E109 Type 1 diabetes mellitus without complications: Secondary | ICD-10-CM | POA: Diagnosis present

## 2014-09-16 HISTORY — DX: Unspecified convulsions: R56.9

## 2014-09-16 HISTORY — DX: Gastro-esophageal reflux disease without esophagitis: K21.9

## 2014-09-16 LAB — BLOOD GAS, VENOUS
Acid-base deficit: 5.5 mmol/L — ABNORMAL HIGH (ref 0.0–2.0)
Bicarbonate: 19.2 mEq/L — ABNORMAL LOW (ref 20.0–24.0)
Drawn by: 105551
FIO2: 0.21 %
O2 Saturation: 49.1 %
PCO2 VEN: 36.7 mmHg — AB (ref 45.0–50.0)
PH VEN: 7.338 — AB (ref 7.250–7.300)
TCO2: 17.8 mmol/L (ref 0–100)
pO2, Ven: 29.8 mmHg — CL (ref 30.0–45.0)

## 2014-09-16 LAB — BASIC METABOLIC PANEL
Anion gap: 18 — ABNORMAL HIGH (ref 5–15)
BUN: 18 mg/dL (ref 6–23)
CO2: 19 mEq/L (ref 19–32)
Calcium: 7.8 mg/dL — ABNORMAL LOW (ref 8.4–10.5)
Chloride: 98 mEq/L (ref 96–112)
Creatinine, Ser: 0.53 mg/dL (ref 0.50–1.10)
Glucose, Bld: 356 mg/dL — ABNORMAL HIGH (ref 70–99)
POTASSIUM: 4.4 meq/L (ref 3.7–5.3)
SODIUM: 135 meq/L — AB (ref 137–147)

## 2014-09-16 LAB — CBC
HCT: 38.5 % (ref 36.0–46.0)
HEMOGLOBIN: 12.9 g/dL (ref 12.0–15.0)
MCH: 29.3 pg (ref 26.0–34.0)
MCHC: 33.5 g/dL (ref 30.0–36.0)
MCV: 87.5 fL (ref 78.0–100.0)
Platelets: 293 10*3/uL (ref 150–400)
RBC: 4.4 MIL/uL (ref 3.87–5.11)
RDW: 14.3 % (ref 11.5–15.5)
WBC: 13.9 10*3/uL — ABNORMAL HIGH (ref 4.0–10.5)

## 2014-09-16 LAB — CBC WITH DIFFERENTIAL/PLATELET
Basophils Absolute: 0 10*3/uL (ref 0.0–0.1)
Basophils Relative: 0 % (ref 0–1)
Eosinophils Absolute: 0 10*3/uL (ref 0.0–0.7)
Eosinophils Relative: 0 % (ref 0–5)
HCT: 40.9 % (ref 36.0–46.0)
HEMOGLOBIN: 14 g/dL (ref 12.0–15.0)
LYMPHS ABS: 1.8 10*3/uL (ref 0.7–4.0)
LYMPHS PCT: 16 % (ref 12–46)
MCH: 29.2 pg (ref 26.0–34.0)
MCHC: 34.2 g/dL (ref 30.0–36.0)
MCV: 85.4 fL (ref 78.0–100.0)
MONOS PCT: 1 % — AB (ref 3–12)
Monocytes Absolute: 0.1 10*3/uL (ref 0.1–1.0)
NEUTROS ABS: 9.4 10*3/uL — AB (ref 1.7–7.7)
NEUTROS PCT: 83 % — AB (ref 43–77)
Platelets: 356 10*3/uL (ref 150–400)
RBC: 4.79 MIL/uL (ref 3.87–5.11)
RDW: 14.2 % (ref 11.5–15.5)
WBC: 11.4 10*3/uL — ABNORMAL HIGH (ref 4.0–10.5)

## 2014-09-16 LAB — COMPREHENSIVE METABOLIC PANEL
ALK PHOS: 104 U/L (ref 39–117)
ALT: 6 U/L (ref 0–35)
ANION GAP: 17 — AB (ref 5–15)
AST: 12 U/L (ref 0–37)
Albumin: 4.3 g/dL (ref 3.5–5.2)
BUN: 18 mg/dL (ref 6–23)
CHLORIDE: 98 meq/L (ref 96–112)
CO2: 22 meq/L (ref 19–32)
Calcium: 9.7 mg/dL (ref 8.4–10.5)
Creatinine, Ser: 0.5 mg/dL (ref 0.50–1.10)
GLUCOSE: 167 mg/dL — AB (ref 70–99)
POTASSIUM: 4.2 meq/L (ref 3.7–5.3)
Sodium: 137 mEq/L (ref 137–147)
Total Bilirubin: 1.4 mg/dL — ABNORMAL HIGH (ref 0.3–1.2)
Total Protein: 8 g/dL (ref 6.0–8.3)

## 2014-09-16 LAB — CBG MONITORING, ED
GLUCOSE-CAPILLARY: 362 mg/dL — AB (ref 70–99)
Glucose-Capillary: 171 mg/dL — ABNORMAL HIGH (ref 70–99)
Glucose-Capillary: 448 mg/dL — ABNORMAL HIGH (ref 70–99)

## 2014-09-16 LAB — URINALYSIS, ROUTINE W REFLEX MICROSCOPIC
BILIRUBIN URINE: NEGATIVE
Glucose, UA: >1000 mg/dL — AB
Hgb urine dipstick: NEGATIVE
Leukocytes, UA: NEGATIVE
NITRITE: NEGATIVE
Protein, ur: NEGATIVE mg/dL
Specific Gravity, Urine: 1.02 (ref 1.005–1.030)
Urobilinogen, UA: 0.2 mg/dL (ref 0.0–1.0)
pH: 6 (ref 5.0–8.0)

## 2014-09-16 LAB — URINE MICROSCOPIC-ADD ON

## 2014-09-16 LAB — KETONES, QUALITATIVE

## 2014-09-16 LAB — LIPASE, BLOOD: LIPASE: 11 U/L (ref 11–59)

## 2014-09-16 LAB — PREGNANCY, URINE: Preg Test, Ur: NEGATIVE

## 2014-09-16 LAB — TROPONIN I: Troponin I: <0.3 ng/mL (ref <0.30)

## 2014-09-16 MED ORDER — LORAZEPAM 2 MG/ML IJ SOLN
1.0000 mg | Freq: Once | INTRAMUSCULAR | Status: AC
  Administered 2014-09-16: 1 mg via INTRAVENOUS
  Filled 2014-09-16: qty 1

## 2014-09-16 MED ORDER — ONDANSETRON HCL 4 MG/2ML IJ SOLN
4.0000 mg | Freq: Once | INTRAMUSCULAR | Status: AC
  Administered 2014-09-16: 4 mg via INTRAVENOUS
  Filled 2014-09-16: qty 2

## 2014-09-16 MED ORDER — SODIUM CHLORIDE 0.9 % IV BOLUS (SEPSIS)
1000.0000 mL | Freq: Once | INTRAVENOUS | Status: AC
  Administered 2014-09-16: 1000 mL via INTRAVENOUS

## 2014-09-16 MED ORDER — QUETIAPINE FUMARATE 100 MG PO TABS
300.0000 mg | ORAL_TABLET | Freq: Every day | ORAL | Status: DC
  Administered 2014-09-17 (×2): 300 mg via ORAL
  Filled 2014-09-16 (×3): qty 1

## 2014-09-16 MED ORDER — CIPROFLOXACIN IN D5W 400 MG/200ML IV SOLN
400.0000 mg | Freq: Once | INTRAVENOUS | Status: AC
  Administered 2014-09-16: 400 mg via INTRAVENOUS
  Filled 2014-09-16: qty 200

## 2014-09-16 MED ORDER — SODIUM CHLORIDE 0.9 % IV SOLN
INTRAVENOUS | Status: DC
  Administered 2014-09-17 – 2014-09-18 (×2): via INTRAVENOUS

## 2014-09-16 MED ORDER — ONDANSETRON 8 MG PO TBDP
8.0000 mg | ORAL_TABLET | Freq: Once | ORAL | Status: AC
  Administered 2014-09-16: 8 mg via ORAL
  Filled 2014-09-16: qty 1

## 2014-09-16 MED ORDER — IOHEXOL 300 MG/ML  SOLN
50.0000 mL | Freq: Once | INTRAMUSCULAR | Status: AC | PRN
  Administered 2014-09-16: 50 mL via ORAL

## 2014-09-16 MED ORDER — METRONIDAZOLE IN NACL 5-0.79 MG/ML-% IV SOLN: 500.0000 mg | Freq: Once | INTRAVENOUS | Status: DC

## 2014-09-16 MED ORDER — IOHEXOL 300 MG/ML  SOLN
100.0000 mL | Freq: Once | INTRAMUSCULAR | Status: AC | PRN
  Administered 2014-09-16: 100 mL via INTRAVENOUS

## 2014-09-16 MED ORDER — DEXTROSE-NACL 5-0.45 % IV SOLN
INTRAVENOUS | Status: DC
  Administered 2014-09-17: 02:00:00 via INTRAVENOUS

## 2014-09-16 MED ORDER — SODIUM CHLORIDE 0.9 % IV SOLN: INTRAVENOUS | Status: DC

## 2014-09-16 MED ORDER — DEXTROSE 50 % IV SOLN: 25.0000 mL | INTRAVENOUS | Status: DC | PRN

## 2014-09-16 MED ORDER — CIPROFLOXACIN IN D5W 400 MG/200ML IV SOLN
400.0000 mg | Freq: Two times a day (BID) | INTRAVENOUS | Status: DC
  Administered 2014-09-17 – 2014-09-18 (×3): 400 mg via INTRAVENOUS
  Filled 2014-09-16 (×3): qty 200

## 2014-09-16 MED ORDER — ONDANSETRON HCL 4 MG/2ML IJ SOLN
4.0000 mg | Freq: Three times a day (TID) | INTRAMUSCULAR | Status: DC | PRN
  Administered 2014-09-17: 4 mg via INTRAVENOUS
  Filled 2014-09-16 (×2): qty 2

## 2014-09-16 MED ORDER — SODIUM CHLORIDE 0.9 % IV SOLN
INTRAVENOUS | Status: DC
  Administered 2014-09-16: 3 [IU]/h via INTRAVENOUS
  Filled 2014-09-16: qty 2.5

## 2014-09-16 MED ORDER — HYDROMORPHONE HCL 1 MG/ML IJ SOLN
1.0000 mg | Freq: Once | INTRAMUSCULAR | Status: AC
  Administered 2014-09-16: 1 mg via INTRAVENOUS
  Filled 2014-09-16: qty 1

## 2014-09-16 MED ORDER — SODIUM CHLORIDE 0.9 % IV SOLN
INTRAVENOUS | Status: DC
  Administered 2014-09-17: 1.5 [IU]/h via INTRAVENOUS
  Filled 2014-09-16: qty 2.5

## 2014-09-16 MED ORDER — METRONIDAZOLE IN NACL 5-0.79 MG/ML-% IV SOLN
500.0000 mg | Freq: Three times a day (TID) | INTRAVENOUS | Status: DC
  Administered 2014-09-17 – 2014-09-18 (×5): 500 mg via INTRAVENOUS
  Filled 2014-09-16 (×5): qty 100

## 2014-09-16 MED ORDER — LORAZEPAM 1 MG PO TABS
1.0000 mg | ORAL_TABLET | Freq: Two times a day (BID) | ORAL | Status: DC
  Administered 2014-09-17 – 2014-09-18 (×4): 1 mg via ORAL
  Filled 2014-09-16: qty 1
  Filled 2014-09-16: qty 2
  Filled 2014-09-16: qty 1
  Filled 2014-09-16: qty 2

## 2014-09-16 MED ORDER — PANTOPRAZOLE SODIUM 40 MG IV SOLR
40.0000 mg | Freq: Once | INTRAVENOUS | Status: AC
  Administered 2014-09-16: 40 mg via INTRAVENOUS
  Filled 2014-09-16: qty 40

## 2014-09-16 MED ORDER — POTASSIUM CHLORIDE 10 MEQ/100ML IV SOLN
10.0000 meq | INTRAVENOUS | Status: DC
  Filled 2014-09-16 (×4): qty 100

## 2014-09-16 NOTE — H&P (Signed)
PCP:   Cassell SmilesFUSCO,LAWRENCE J., MD   Chief Complaint:  n/v  HPI59: 33 yo female h/o dm1, IBS comes in with several days of n/v and abd pain.  Says she always has llq abd pain and sometimes there is even a "bulge" there.  No fevers.  Always has diarrhea with her ibs.  Had colitis this past summer was treated with cipro/flagyl and had colonscopy and egd with dr fields.  Was told by dr fields that much of her symptoms are associated with stress.  She denies fevers.  Reports some wt loss.  No dysuria.  Review of Systems:  Positive and negative as per HPI otherwise all other systems are negative  Past Medical History: Past Medical History  Diagnosis Date  . Diabetes mellitus type 1   . Asthma   . IBS (irritable bowel syndrome)   . GERD (gastroesophageal reflux disease)   . Seizure    Past Surgical History  Procedure Laterality Date  . Appendectomy    . Knee surgery      left  . Tubal ligation    . Wrist surgery      left  . Colonoscopy  08/24/2012    NL COLON Bx  . Esophagogastroduodenoscopy  08/24/2012    NL DUO Bx  . Colonoscopy with propofol N/A 05/31/2014    NL COLON Bx  . Esophagogastroduodenoscopy (egd) with propofol N/A 05/31/2014    GASTRITIS, NL DUO Bx  . Esophageal biopsy N/A 05/31/2014    GASTRIC BX NOT ESOPHAGEAL    Medications: Prior to Admission medications   Medication Sig Start Date End Date Taking? Authorizing Provider  dicyclomine (BENTYL) 10 MG capsule Take 10 mg by mouth 2 (two) times daily.   Yes Historical Provider, MD  HYDROcodone-acetaminophen (NORCO/VICODIN) 5-325 MG per tablet Take 1 tablet by mouth every 6 (six) hours as needed for moderate pain.   Yes Historical Provider, MD  insulin lispro (HUMALOG) 100 UNIT/ML injection Inject 1-8 Units into the skin 3 (three) times daily before meals. Sliding Scale.   Yes Historical Provider, MD  insulin lispro protamine-insulin lispro (HUMALOG 75/25) (75-25) 100 UNIT/ML SUSP Inject 14 Units into the skin 2 (two) times  daily with a meal.    Yes Historical Provider, MD  LORazepam (ATIVAN) 1 MG tablet Take 1 mg by mouth 2 (two) times daily.   Yes Historical Provider, MD  QUEtiapine (SEROQUEL) 300 MG tablet Take 300 mg by mouth at bedtime.   Yes Historical Provider, MD  LORazepam (ATIVAN) 1 MG tablet Take 1 tablet (1 mg total) by mouth 2 (two) times daily. Patient not taking: Reported on 09/16/2014 07/13/14   Benny LennertJoseph L Zammit, MD  omeprazole (PRILOSEC) 20 MG capsule Take 20 mg by mouth daily.    Historical Provider, MD  QUEtiapine (SEROQUEL) 100 MG tablet Take 300 mg by mouth at bedtime.     Historical Provider, MD    Allergies:   Allergies  Allergen Reactions  . Sulfa Antibiotics Hives    Social History:  reports that she has been smoking Cigarettes.  She has a 18 pack-year smoking history. She has never used smokeless tobacco. She reports that she does not drink alcohol or use illicit drugs.  Family History: Family History  Problem Relation Age of Onset  . Colon cancer Neg Hx   . Diabetes Other   . Stroke Other     Physical Exam: Filed Vitals:   09/16/14 1842 09/16/14 1900 09/16/14 2030 09/16/14 2100  BP: 104/74 88/66 112/71 99/65  Pulse: 96 99 86 83  Temp: 98.4 F (36.9 C)     TempSrc: Oral     Resp: 14 15 17 13   SpO2: 100% 100% 100% 99%   General appearance: alert, cooperative and no distress Head: Normocephalic, without obvious abnormality, atraumatic Eyes: negative Nose: Nares normal. Septum midline. Mucosa normal. No drainage or sinus tenderness. Neck: no JVD and supple, symmetrical, trachea midline Lungs: clear to auscultation bilaterally Heart: regular rate and rhythm, S1, S2 normal, no murmur, click, rub or gallop Abdomen: soft, non-tender; bowel sounds normal; no masses,  no organomegaly Extremities: extremities normal, atraumatic, no cyanosis or edema Pulses: 2+ and symmetric Skin: Skin color, texture, turgor normal. No rashes or lesions Neurologic: Grossly normal   Labs on  Admission:   Recent Labs  09/16/14 1523 09/16/14 2026  NA 137 135*  K 4.2 4.4  CL 98 98  CO2 22 19  GLUCOSE 167* 356*  BUN 18 18  CREATININE 0.50 0.53  CALCIUM 9.7 7.8*    Recent Labs  09/16/14 1523  AST 12  ALT 6  ALKPHOS 104  BILITOT 1.4*  PROT 8.0  ALBUMIN 4.3    Recent Labs  09/16/14 1523  WBC 11.4*  NEUTROABS 9.4*  HGB 14.0  HCT 40.9  MCV 85.4  PLT 356    Recent Labs  09/16/14 1523  TROPONINI <0.30   Radiological Exams on Admission: Ct Abdomen Pelvis W Contrast  09/16/2014   CLINICAL DATA:  Left lower quadrant pain, nausea/vomiting, hematemesis  EXAM: CT ABDOMEN AND PELVIS WITH CONTRAST  TECHNIQUE: Multidetector CT imaging of the abdomen and pelvis was performed using the standard protocol following bolus administration of intravenous contrast.  CONTRAST:  100mL OMNIPAQUE IOHEXOL 300 MG/ML  SOLN  COMPARISON:  05/12/2014  FINDINGS: Lower chest:  Lung bases are clear.  Hepatobiliary: Liver is notable for focal fat/altered perfusion along the falciform ligament.  Gallbladder is unremarkable. No intrahepatic or extrahepatic ductal dilatation.  Pancreas: Within normal limits.  Spleen: Within normal limits.  Adrenals/Urinary Tract: Adrenal glands are unremarkable.  Kidneys are within normal limits.  No hydronephrosis.  Bladder is mildly thick-walled although underdistended.  Stomach/Bowel: Stomach is unremarkable.  No evidence of bowel obstruction.  Prior appendectomy.  Wall thickening predominantly involving the sigmoid colon, correlate for infectious/ inflammatory colitis.  Vascular/Lymphatic: No evidence of abdominal aortic aneurysm.  No suspicious abdominopelvic lymphadenopathy.  Reproductive: Uterus is unremarkable.  Bilateral ovaries are grossly unremarkable.  Other: Trace pelvic ascites.  Musculoskeletal: Visualized osseous structures are within normal limits.  IMPRESSION: Wall thickening involving the sigmoid colon, correlate for infectious/ inflammatory colitis.   No evidence of bowel obstruction.  Prior appendectomy.  Bladder wall thickening, favored to reflect underdistention.   Electronically Signed   By: Charline BillsSriyesh  Krishnan M.D.   On: 09/16/2014 21:40    Assessment/Plan  33 yo female with DKA and what appears to be recurrent colitis inflammatory vs infectious  Principal Problem:   DKA (diabetic ketoacidoses)-  Place in stepdown on dka pathway.  dka is mild.  Should correct by morning.  May have been exacerbated by gi issues.  Active Problems:   Nausea & vomiting   IBS (irritable bowel syndrome)   Diabetes mellitus type I   Hematemesis   Colitis, acute-  Cipro/flagyl.  Stool cx and cdiff.  May be related to inflammatory /ibs.  Will consult GI for recs and further w/u.    Chari Parmenter A 09/16/2014, 9:56 PM

## 2014-09-16 NOTE — ED Provider Notes (Signed)
CSN: [id number]     Arrival date & time 09/16/14  1448 History  This chart was scribed for Kristin Octave, MD by Gwenyth Ober, ED Scribe. This patient was seen in room APA10/APA10 and the patient's care was started at 5:44 PM.    Chief Complaint  Patient presents with  . Abdominal Pain   The history is provided by the patient. No language interpreter was used.    HPI Comments: Kristin [name] is a 33 y.o. female with history of GERD, IBS, DM, and gastritis who presents to the Emergency Department complaining of constant, left, upper quadrant abdominal pain that started last night and vomiting that started 12 hours ago. Pt states diarrhea and nausea as associated symptoms. She also notes streaks of blood in vomit since arrival to ED. Pt has a history of similar symptoms and was admitted to [facility] 4 months ago. Her mother states that her blood sugar at home measured 246 and that they administered 5 units of Insulin. She denies a history of ulcers. Pt has had appendectomy and tubal ligation. She takes dicyclomine for diarrhea, but denies taking any other medications for symptoms. Her LNMP was 3 weeks ago. Pt denies CP, back pain, and blood in stool as associated symptoms.    PCP is [facility] [facility] [employer]  Past Medical History  Diagnosis Date  . Diabetes mellitus type 1   . Asthma   . IBS (irritable bowel syndrome)   . GERD (gastroesophageal reflux disease)   . Seizure    Past Surgical History  Procedure Laterality Date  . Appendectomy    . Knee surgery      left  . Tubal ligation    . Wrist surgery      left  . Colonoscopy  08/24/2012    NL COLON Bx  . Esophagogastroduodenoscopy  08/24/2012    NL DUO Bx  . Colonoscopy with propofol N/A 05/31/2014    NL COLON Bx  . Esophagogastroduodenoscopy (egd) with propofol N/A 05/31/2014    GASTRITIS, NL DUO Bx  . Esophageal biopsy N/A 05/31/2014    GASTRIC BX NOT ESOPHAGEAL   Family History  Problem Relation Age of Onset   . Colon cancer Neg Hx   . Diabetes Other   . Stroke Other    History  Substance Use Topics  . Smoking status: Current Every Day Smoker -- 1.50 packs/day for 12 years    Types: Cigarettes  . Smokeless tobacco: Current User  . Alcohol Use: No   OB History    Gravida Para Term Preterm AB TAB SAB Ectopic Multiple Living   2 2 2       2      Review of Systems  10 Systems reviewed and all are negative for acute change except as noted in the HPI.   Allergies  Sulfa antibiotics  Home Medications   Prior to Admission medications   Medication Sig Start Date End Date Taking? Authorizing Provider  dicyclomine (BENTYL) 10 MG capsule Take 10 mg by mouth 2 (two) times daily.   Yes Historical Provider, MD  HYDROcodone-acetaminophen (NORCO/VICODIN) 5-325 MG per tablet Take 1 tablet by mouth every 6 (six) hours as needed for moderate pain.   Yes Historical Provider, MD  insulin lispro (HUMALOG) 100 UNIT/ML injection Inject 1-8 Units into the skin 3 (three) times daily before meals. Sliding Scale.   Yes Historical Provider, MD  insulin lispro protamine-insulin lispro (HUMALOG 75/25) (75-25) 100 UNIT/ML SUSP Inject 14 Units into the skin  2 (two) times daily with a meal.    Yes Historical Provider, MD  LORazepam (ATIVAN) 1 MG tablet Take 1 mg by mouth 2 (two) times daily.   Yes Historical Provider, MD  QUEtiapine (SEROQUEL) 300 MG tablet Take 300 mg by mouth at bedtime.   Yes Historical Provider, MD  LORazepam (ATIVAN) 1 MG tablet Take 1 tablet (1 mg total) by mouth 2 (two) times daily. Patient not taking: Reported on 09/16/2014 07/13/14   Benny LennertJoseph L Zammit, MD  omeprazole (PRILOSEC) 20 MG capsule Take 20 mg by mouth daily.    Historical Provider, MD  QUEtiapine (SEROQUEL) 100 MG tablet Take 300 mg by mouth at bedtime.     Historical Provider, MD   BP 99/53 mmHg  Pulse 90  Temp(Src) 98.2 F (36.8 C) (Oral)  Resp 15  Ht 5\' 2"  (1.575 m)  Wt 115 lb 8.3 oz (52.4 kg)  BMI 21.12 kg/m2  SpO2 100%   LMP 08/26/2014 Physical Exam  Constitutional: She is oriented to person, place, and time. She appears well-developed and well-nourished. No distress.  Crying, screaming, anxious  HENT:  Head: Normocephalic and atraumatic.  Mouth/Throat: Oropharynx is clear and moist. No oropharyngeal exudate.  Eyes: Conjunctivae and EOM are normal. Pupils are equal, round, and reactive to light.  Neck: Normal range of motion. Neck supple.  No meningismus.  Cardiovascular: Regular rhythm, normal heart sounds and intact distal pulses.   No murmur heard. Tachycardic  Pulmonary/Chest: Effort normal and breath sounds normal. No respiratory distress.  Abdominal: Soft. There is tenderness. There is no rebound and no guarding.  Abdomen soft, tender on left side; no guarding, no rebound. Emesis bag with blood streaks.  Genitourinary:  Rectal exam, no hemorrhoids, no gross blood. Chaperone present.   Musculoskeletal: Normal range of motion. She exhibits no edema or tenderness.  Neurological: She is alert and oriented to person, place, and time. No cranial nerve deficit. She exhibits normal muscle tone. Coordination normal.  No ataxia on finger to nose bilaterally. No pronator drift. 5/5 strength throughout. CN 2-12 intact. Negative Romberg. Equal grip strength. Sensation intact. Gait is normal.   Skin: Skin is warm.  Psychiatric: She has a normal mood and affect. Her behavior is normal.  Nursing note and vitals reviewed.   Chaperone (scribe) was present for exam which was performed with no discomfort or complications.    ED Course  Procedures (including critical care time) DIAGNOSTIC STUDIES: Oxygen Saturation is 100% on , normal by my interpretation.    COORDINATION OF CARE: 5:53 PM Discussed treatment plan with pt at bedside and pt agreed to plan.  Labs Review Labs Reviewed  CBC WITH DIFFERENTIAL - Abnormal; Notable for the following:    WBC 11.4 (*)    Neutrophils Relative % 83 (*)    Neutro Abs  9.4 (*)    Monocytes Relative 1 (*)    All other components within normal limits  COMPREHENSIVE METABOLIC PANEL - Abnormal; Notable for the following:    Glucose, Bld 167 (*)    Total Bilirubin 1.4 (*)    Anion gap 17 (*)    All other components within normal limits  URINALYSIS, ROUTINE W REFLEX MICROSCOPIC - Abnormal; Notable for the following:    Glucose, UA >1000 (*)    Ketones, ur >80 (*)    All other components within normal limits  BLOOD GAS, VENOUS - Abnormal; Notable for the following:    pH, Ven 7.338 (*)    pCO2, Ven 36.7 (*)  pO2, Ven 29.8 (*)    Bicarbonate 19.2 (*)    Acid-base deficit 5.5 (*)    All other components within normal limits  KETONES, QUALITATIVE - Abnormal; Notable for the following:    Acetone, Bld SMALL (*)    All other components within normal limits  BASIC METABOLIC PANEL - Abnormal; Notable for the following:    Sodium 135 (*)    Glucose, Bld 356 (*)    Calcium 7.8 (*)    Anion gap 18 (*)    All other components within normal limits  KETONES, QUALITATIVE - Abnormal; Notable for the following:    Acetone, Bld SMALL (*)    All other components within normal limits  BASIC METABOLIC PANEL - Abnormal; Notable for the following:    Sodium 132 (*)    Chloride 94 (*)    CO2 13 (*)    Glucose, Bld 409 (*)    Calcium 8.2 (*)    Anion gap 25 (*)    All other components within normal limits  CBC - Abnormal; Notable for the following:    WBC 13.9 (*)    All other components within normal limits  CBG MONITORING, ED - Abnormal; Notable for the following:    Glucose-Capillary 171 (*)    All other components within normal limits  CBG MONITORING, ED - Abnormal; Notable for the following:    Glucose-Capillary 362 (*)    All other components within normal limits  CBG MONITORING, ED - Abnormal; Notable for the following:    Glucose-Capillary 448 (*)    All other components within normal limits  MRSA PCR SCREENING  STOOL CULTURE  PREGNANCY, URINE   TROPONIN I  URINE MICROSCOPIC-ADD ON  LIPASE, BLOOD  BASIC METABOLIC PANEL  BASIC METABOLIC PANEL  BASIC METABOLIC PANEL    Imaging Review Ct Abdomen Pelvis W Contrast  09/16/2014   CLINICAL DATA:  Left lower quadrant pain, nausea/vomiting, hematemesis  EXAM: CT ABDOMEN AND PELVIS WITH CONTRAST  TECHNIQUE: Multidetector CT imaging of the abdomen and pelvis was performed using the standard protocol following bolus administration of intravenous contrast.  CONTRAST:  OMNIPAQUE IOHEXOL 300 MG/ML  SOLN  COMPARISON:  05/12/2014  FINDINGS: Lower chest:  Lung bases are clear.  Hepatobiliary: Liver is notable for focal fat/altered perfusion along the falciform ligament.  Gallbladder is unremarkable. No intrahepatic or extrahepatic ductal dilatation.  Pancreas: Within normal limits.  Spleen: Within normal limits.  Adrenals/Urinary Tract: Adrenal glands are unremarkable.  Kidneys are within normal limits.  No hydronephrosis.  Bladder is mildly thick-walled although underdistended.  Stomach/Bowel: Stomach is unremarkable.  No evidence of bowel obstruction.  Prior appendectomy.  Wall thickening predominantly involving the sigmoid colon, correlate for infectious/ inflammatory colitis.  Vascular/Lymphatic: No evidence of abdominal aortic aneurysm.  No suspicious abdominopelvic lymphadenopathy.  Reproductive: Uterus is unremarkable.  Bilateral ovaries are grossly unremarkable.  Other: Trace pelvic ascites.  Musculoskeletal: Visualized osseous structures are within normal limits.  IMPRESSION: Wall thickening involving the sigmoid colon, correlate for infectious/ inflammatory colitis.  No evidence of bowel obstruction.  Prior appendectomy.  Bladder wall thickening, favored to reflect underdistention.   Electronically Signed   By: Charline Bills M.D.   On: 09/16/2014 21:40     EKG Interpretation   Date/Time:  Friday September 16 2014 18:43:03 EST Ventricular Rate:  85 PR Interval:  133 QRS Duration: 91 QT  Interval:  398 QTC Calculation: 473 R Axis:   68 Text Interpretation:  Sinus rhythm Probable left atrial  enlargement RSR'  in V1 or V2, probably normal variant No significant change was found  Confirmed by Manus GunningANCOUR  MD, Rollan Roger [phone]) on 09/16/2014 6:46:50 PM      MDM   Final diagnoses:  Abdominal pain  Diabetic ketoacidosis without coma associated with type 1 diabetes mellitus  Colitis  History of type 1 diabetes of IBS and reflux disease presenting with abdominal pain with nausea, vomiting and diarrhea for the past day. Blood streaks noted to vomit on arrival. No anticoagulant use. She is anxious, tearful and tachycardic.  EGD in July showed gastritis. Colonoscopy showed small internal hemorrhoids.  Anion gap 17 with small ketones.  Sugar 167.  Suspect starvation ketosis rather than DKA.  Repeat chemistry shows anion gap 18 sugar 356. Concern for early DKA. Large ketones in urine. We'll start insulin drip.  Insulin drip for early DKA. Continue IV fluids. CT scan shows sigmoid colitis. Patient with negative colonoscopy last July. Start Cipro and Flagyl. Admission discussed with Dr. Onalee Huaavid  CRITICAL CARE Performed by: Kristin OctaveANCOUR, [place] Total critical care time: 40 Critical care time was exclusive of separately billable procedures and treating other patients. Critical care was necessary to treat or prevent imminent or life-threatening deterioration. Critical care was time spent personally by me on the following activities: development of treatment plan with patient and/or surrogate as well as nursing, discussions with consultants, evaluation of patient's response to treatment, examination of patient, obtaining history from patient or surrogate, ordering and performing treatments and interventions, ordering and review of laboratory studies, ordering and review of radiographic studies, pulse oximetry and re-evaluation of patient's condition.    I personally performed the services  described in this documentation, which was scribed in my presence. The recorded information has been reviewed and is accurate.     Kristin OctaveStephen Della Homan, MD 09/17/14 [phone]

## 2014-09-16 NOTE — ED Notes (Signed)
PT c/o left upper quadrant abdominal pain x1 day with n/v/d. PT family gave her 5 units of novolog at home approx 1.5hrs ago for CBG of 254.

## 2014-09-16 NOTE — ED Notes (Signed)
CRITICAL VALUE ALERT  Critical value received:  PO2 - 29.8  Date of notification:  09/16/2014  Time of notification:  1931  Critical value read back: yes   Nurse who received alert:  LJS  MD notified (1st page):  Dr Manus Gunningancour  Time of first page:  1931  MD notified (2nd page):  Time of second page:  Responding MD:  Dr Manus Gunningancour  Time MD responded:  [id number]

## 2014-09-16 NOTE — ED Notes (Signed)
Patient up to bathroom. Patient ambulates with no difficulty or assistance.

## 2014-09-17 DIAGNOSIS — R1032 Left lower quadrant pain: Secondary | ICD-10-CM

## 2014-09-17 DIAGNOSIS — R112 Nausea with vomiting, unspecified: Secondary | ICD-10-CM

## 2014-09-17 DIAGNOSIS — R197 Diarrhea, unspecified: Secondary | ICD-10-CM

## 2014-09-17 DIAGNOSIS — K921 Melena: Secondary | ICD-10-CM

## 2014-09-17 DIAGNOSIS — K219 Gastro-esophageal reflux disease without esophagitis: Secondary | ICD-10-CM

## 2014-09-17 DIAGNOSIS — E1010 Type 1 diabetes mellitus with ketoacidosis without coma: Principal | ICD-10-CM

## 2014-09-17 LAB — BASIC METABOLIC PANEL
Anion gap: 14 (ref 5–15)
Anion gap: 16 — ABNORMAL HIGH (ref 5–15)
Anion gap: 16 — ABNORMAL HIGH (ref 5–15)
Anion gap: 25 — ABNORMAL HIGH (ref 5–15)
BUN: 14 mg/dL (ref 6–23)
BUN: 10 mg/dL (ref 6–23)
BUN: 12 mg/dL (ref 6–23)
BUN: 16 mg/dL (ref 6–23)
CHLORIDE: 100 meq/L (ref 96–112)
CHLORIDE: 100 meq/L (ref 96–112)
CHLORIDE: 103 meq/L (ref 96–112)
CO2: 13 mEq/L — ABNORMAL LOW (ref 19–32)
CO2: 19 mEq/L (ref 19–32)
CO2: 19 meq/L (ref 19–32)
CO2: 20 meq/L (ref 19–32)
CREATININE: 0.5 mg/dL (ref 0.50–1.10)
Calcium: 7.9 mg/dL — ABNORMAL LOW (ref 8.4–10.5)
Calcium: 8 mg/dL — ABNORMAL LOW (ref 8.4–10.5)
Calcium: 8.2 mg/dL — ABNORMAL LOW (ref 8.4–10.5)
Calcium: 8.3 mg/dL — ABNORMAL LOW (ref 8.4–10.5)
Chloride: 94 mEq/L — ABNORMAL LOW (ref 96–112)
Creatinine, Ser: 0.53 mg/dL (ref 0.50–1.10)
Creatinine, Ser: 0.5 mg/dL (ref 0.50–1.10)
Creatinine, Ser: 0.48 mg/dL — ABNORMAL LOW (ref 0.50–1.10)
GFR calc Af Amer: >90 mL/min (ref >90)
GFR calc Af Amer: >90 mL/min (ref >90)
GFR calc Af Amer: >90 mL/min (ref >90)
GFR calc Af Amer: >90 mL/min (ref >90)
GFR calc non Af Amer: >90 mL/min (ref >90)
GFR calc non Af Amer: >90 mL/min (ref >90)
GFR calc non Af Amer: >90 mL/min (ref >90)
GFR calc non Af Amer: >90 mL/min (ref >90)
GLUCOSE: 188 mg/dL — AB (ref 70–99)
GLUCOSE: 409 mg/dL — AB (ref 70–99)
Glucose, Bld: 212 mg/dL — ABNORMAL HIGH (ref 70–99)
Glucose, Bld: 162 mg/dL — ABNORMAL HIGH (ref 70–99)
POTASSIUM: 3.8 meq/L (ref 3.7–5.3)
POTASSIUM: 4 meq/L (ref 3.7–5.3)
POTASSIUM: 4.1 meq/L (ref 3.7–5.3)
POTASSIUM: 4.3 meq/L (ref 3.7–5.3)
Sodium: 132 mEq/L — ABNORMAL LOW (ref 137–147)
Sodium: 135 mEq/L — ABNORMAL LOW (ref 137–147)
Sodium: 135 mEq/L — ABNORMAL LOW (ref 137–147)
Sodium: 137 mEq/L (ref 137–147)

## 2014-09-17 LAB — GLUCOSE, CAPILLARY
GLUCOSE-CAPILLARY: 140 mg/dL — AB (ref 70–99)
GLUCOSE-CAPILLARY: 164 mg/dL — AB (ref 70–99)
GLUCOSE-CAPILLARY: 138 mg/dL — AB (ref 70–99)
GLUCOSE-CAPILLARY: 125 mg/dL — AB (ref 70–99)
GLUCOSE-CAPILLARY: 114 mg/dL — AB (ref 70–99)
Glucose-Capillary: 296 mg/dL — ABNORMAL HIGH (ref 70–99)
Glucose-Capillary: 163 mg/dL — ABNORMAL HIGH (ref 70–99)
Glucose-Capillary: 161 mg/dL — ABNORMAL HIGH (ref 70–99)
Glucose-Capillary: 177 mg/dL — ABNORMAL HIGH (ref 70–99)
Glucose-Capillary: 175 mg/dL — ABNORMAL HIGH (ref 70–99)
Glucose-Capillary: 183 mg/dL — ABNORMAL HIGH (ref 70–99)
Glucose-Capillary: 145 mg/dL — ABNORMAL HIGH (ref 70–99)
Glucose-Capillary: 219 mg/dL — ABNORMAL HIGH (ref 70–99)
Glucose-Capillary: 140 mg/dL — ABNORMAL HIGH (ref 70–99)

## 2014-09-17 LAB — MRSA PCR SCREENING: MRSA by PCR: NEGATIVE

## 2014-09-17 MED ORDER — PANTOPRAZOLE SODIUM 40 MG PO TBEC
40.0000 mg | DELAYED_RELEASE_TABLET | Freq: Every day | ORAL | Status: DC
  Administered 2014-09-17 – 2014-09-18 (×2): 40 mg via ORAL
  Filled 2014-09-17 (×2): qty 1

## 2014-09-17 MED ORDER — PROMETHAZINE HCL 25 MG/ML IJ SOLN
12.5000 mg | Freq: Four times a day (QID) | INTRAMUSCULAR | Status: DC | PRN
  Administered 2014-09-17 – 2014-09-18 (×5): 12.5 mg via INTRAVENOUS
  Filled 2014-09-17 (×5): qty 1

## 2014-09-17 MED ORDER — HYDROMORPHONE HCL 1 MG/ML IJ SOLN
1.0000 mg | INTRAMUSCULAR | Status: DC | PRN
  Administered 2014-09-17 – 2014-09-18 (×8): 1 mg via INTRAVENOUS
  Filled 2014-09-17 (×8): qty 1

## 2014-09-17 MED ORDER — QUETIAPINE FUMARATE 100 MG PO TABS
ORAL_TABLET | ORAL | Status: AC
  Filled 2014-09-17: qty 3

## 2014-09-17 MED ORDER — INSULIN GLARGINE 100 UNIT/ML ~~LOC~~ SOLN
12.0000 [IU] | SUBCUTANEOUS | Status: AC
  Administered 2014-09-17: 12 [IU] via SUBCUTANEOUS
  Filled 2014-09-17: qty 0.12

## 2014-09-17 MED ORDER — INSULIN ASPART PROT & ASPART (70-30 MIX) 100 UNIT/ML ~~LOC~~ SUSP
14.0000 [IU] | Freq: Two times a day (BID) | SUBCUTANEOUS | Status: DC
  Administered 2014-09-17 – 2014-09-18 (×2): 14 [IU] via SUBCUTANEOUS
  Filled 2014-09-17: qty 10

## 2014-09-17 MED ORDER — HYDROMORPHONE HCL 1 MG/ML IJ SOLN
1.0000 mg | Freq: Once | INTRAMUSCULAR | Status: AC
  Administered 2014-09-17: 1 mg via INTRAVENOUS
  Filled 2014-09-17: qty 1

## 2014-09-17 MED ORDER — CETYLPYRIDINIUM CHLORIDE 0.05 % MT LIQD
7.0000 mL | Freq: Two times a day (BID) | OROMUCOSAL | Status: DC
  Administered 2014-09-17 – 2014-09-18 (×4): 7 mL via OROMUCOSAL

## 2014-09-17 MED ORDER — POTASSIUM CHLORIDE 10 MEQ/100ML IV SOLN
10.0000 meq | INTRAVENOUS | Status: AC
  Administered 2014-09-17 (×4): 10 meq via INTRAVENOUS

## 2014-09-17 MED ORDER — NICOTINE 21 MG/24HR TD PT24
21.0000 mg | MEDICATED_PATCH | Freq: Every day | TRANSDERMAL | Status: DC
  Administered 2014-09-17 – 2014-09-18 (×2): 21 mg via TRANSDERMAL
  Filled 2014-09-17 (×2): qty 1

## 2014-09-17 MED ORDER — INFLUENZA VAC SPLIT QUAD 0.5 ML IM SUSY
0.5000 mL | PREFILLED_SYRINGE | INTRAMUSCULAR | Status: AC
  Administered 2014-09-18: 0.5 mL via INTRAMUSCULAR
  Filled 2014-09-17: qty 0.5

## 2014-09-17 MED ORDER — INSULIN ASPART 100 UNIT/ML ~~LOC~~ SOLN
0.0000 [IU] | Freq: Three times a day (TID) | SUBCUTANEOUS | Status: DC
  Administered 2014-09-17 – 2014-09-18 (×2): 1 [IU] via SUBCUTANEOUS
  Administered 2014-09-18: 2 [IU] via SUBCUTANEOUS

## 2014-09-17 NOTE — Plan of Care (Signed)
Problem: Phase I Progression Outcomes Goal: CBGs steadily decreasing on IV insulin drip Outcome: Progressing Goal: K+ level approaching normal with therapy Outcome: Progressing Goal: Nausea/vomiting controlled with antiemetics Outcome: Progressing Goal: Pain controlled with appropriate interventions Outcome: Progressing Goal: Voiding-avoid urinary catheter unless indicated Outcome: Progressing

## 2014-09-17 NOTE — Progress Notes (Signed)
Pt transferring to room 331. Pt is aware and agreeable to transfer, report given to receiving RN. All belongings transferred with pt at bedside.

## 2014-09-17 NOTE — Progress Notes (Signed)
[facility] PROGRESS NOTE  Kristin [name] WUJ:[id number] DOB: 03/06/1981 DOA: 09/16/2014 PCP: Cassell SmilesFUSCO,LAWRENCE J., MD  Assessment/Plan: 1. Diabetic ketoacidosis. Patient was placed on intravenous hydration and IV insulin infusion per protocol. Her anion gap has closed. We'll plan on transitioning to subcutaneous insulin once she is tolerating oral intake. We'll start her on clear liquids. 2. Possible sigmoid colitis. Started on ciprofloxacin and Flagyl. GI pathogen panel and stool for C. Difficile need to be collected. She recently had upper and lower endoscopies done. GI has been consulted. 3. Abdominal pain. Possibly related to #2. 4. Irritable bowel syndrome. 5. GERD. Continue proton pump inhibitors  Code Status: full Family Communication: discussed with patient  Disposition Plan: discharge home once improved   Consultants:  GI  Procedures:    Antibiotics:  cipro 11/6  Flagyl 11/6  HPI/Subjective: Patient says she is feeling better today. Still has some nausea and abdominal pain, but no vomiting.   Objective: Filed Vitals:   09/17/14 0830  BP: 99/58  Pulse: 69  Temp:   Resp: 14    Intake/Output Summary (Last 24 hours) at 09/17/14 0909 Last data filed at 09/17/14 0713  Gross per 24 hour  Intake 1038.42 ml  Output   1300 ml  Net -261.58 ml   Filed Weights   09/16/14 2300  Weight: 52.4 kg (115 lb 8.3 oz)    Exam:   General:  NAD  Cardiovascular: s1, s2, rrr  Respiratory: cta b  Abdomen: soft, nt, nd, bs+  Musculoskeletal:  No edema b/l   Data Reviewed: Basic Metabolic Panel:  Recent Labs Lab 09/16/14 2026 09/16/14 2335 09/17/14 0122 09/17/14 0356 09/17/14 0617  NA 135* 132* 135* 135* 137  K 4.4 4.0 3.8 4.3 4.1  CL 98 94* 100 100 103  CO2 19 13* 19 19 20   GLUCOSE 356* 409* 212* 162* 188*  BUN 18 16 14 12 10   CREATININE 0.53 0.50 0.53 0.50 0.48*  CALCIUM 7.8* 8.2* 8.3* 7.9* 8.0*   Liver Function Tests:  Recent Labs Lab  09/16/14 1523  AST 12  ALT 6  ALKPHOS 104  BILITOT 1.4*  PROT 8.0  ALBUMIN 4.3    Recent Labs Lab 09/16/14 2026  LIPASE 11   No results for input(s): AMMONIA in the last 168 hours. CBC:  Recent Labs Lab 09/16/14 1523 09/16/14 2335  WBC 11.4* 13.9*  NEUTROABS 9.4*  --   HGB 14.0 12.9  HCT 40.9 38.5  MCV 85.4 87.5  PLT 356 293   Cardiac Enzymes:  Recent Labs Lab 09/16/14 1523  TROPONINI <0.30   BNP (last 3 results) No results for input(s): PROBNP in the last 8760 hours. CBG:  Recent Labs Lab 09/17/14 0303 09/17/14 0424 09/17/14 0535 09/17/14 0710 09/17/14 0818  GLUCAP 140* 161* 177* 175* 183*    Recent Results (from the past 240 hour(s))  MRSA PCR Screening     Status: None   Collection Time: 09/16/14 11:24 PM  Result Value Ref Range Status   MRSA by PCR NEGATIVE NEGATIVE Final    Comment:        The GeneXpert MRSA Assay (FDA approved for NASAL specimens only), is one component of a comprehensive MRSA colonization surveillance program. It is not intended to diagnose MRSA infection nor to guide or monitor treatment for MRSA infections.      Studies: Ct Abdomen Pelvis W Contrast  09/16/2014   CLINICAL DATA:  Left lower quadrant pain, nausea/vomiting, hematemesis  EXAM: CT ABDOMEN AND PELVIS WITH CONTRAST  TECHNIQUE: Multidetector CT imaging of the abdomen and pelvis was performed using the standard protocol following bolus administration of intravenous contrast.  CONTRAST:  100mL OMNIPAQUE IOHEXOL 300 MG/ML  SOLN  COMPARISON:  05/12/2014  FINDINGS: Lower chest:  Lung bases are clear.  Hepatobiliary: Liver is notable for focal fat/altered perfusion along the falciform ligament.  Gallbladder is unremarkable. No intrahepatic or extrahepatic ductal dilatation.  Pancreas: Within normal limits.  Spleen: Within normal limits.  Adrenals/Urinary Tract: Adrenal glands are unremarkable.  Kidneys are within normal limits.  No hydronephrosis.  Bladder is mildly  thick-walled although underdistended.  Stomach/Bowel: Stomach is unremarkable.  No evidence of bowel obstruction.  Prior appendectomy.  Wall thickening predominantly involving the sigmoid colon, correlate for infectious/ inflammatory colitis.  Vascular/Lymphatic: No evidence of abdominal aortic aneurysm.  No suspicious abdominopelvic lymphadenopathy.  Reproductive: Uterus is unremarkable.  Bilateral ovaries are grossly unremarkable.  Other: Trace pelvic ascites.  Musculoskeletal: Visualized osseous structures are within normal limits.  IMPRESSION: Wall thickening involving the sigmoid colon, correlate for infectious/ inflammatory colitis.  No evidence of bowel obstruction.  Prior appendectomy.  Bladder wall thickening, favored to reflect underdistention.   Electronically Signed   By: Charline BillsSriyesh  Krishnan M.D.   On: 09/16/2014 21:40    Scheduled Meds: . antiseptic oral rinse  7 mL Mouth Rinse BID  . ciprofloxacin  400 mg Intravenous Q12H  . [START ON 09/18/2014] Influenza vac split quadrivalent PF  0.5 mL Intramuscular Tomorrow-1000  . LORazepam  1 mg Oral BID  . metronidazole  500 mg Intravenous Q8H  . QUEtiapine  300 mg Oral QHS   Continuous Infusions: . sodium chloride Stopped (09/17/14 0206)  . dextrose 5 % and 0.45% NaCl 75 mL/hr at 09/17/14 0205  . insulin (NOVOLIN-R) infusion 2.5 Units/hr (09/17/14 0830)    Principal Problem:   DKA (diabetic ketoacidoses) Active Problems:   Nausea & vomiting   IBS (irritable bowel syndrome)   Diabetes mellitus type I   Hematemesis   Colitis, acute    Time spent: 25mins    [facility] Center LLCMEMON,JEHANZEB  [facility] Pager [phone]. If 7PM-7AM, please contact night-coverage at [email or web address], password [email or web address] 09/17/2014, 9:09 AM  LOS: 1 day

## 2014-09-17 NOTE — Consult Note (Signed)
Referring Provider: Erick BlinksJehanzeb Memon, MD Primary Care Physician:  Cassell SmilesFUSCO,LAWRENCE J., MD Primary Gastroenterologist:  Dr. Karilyn Cotaehman  Reason for Consultation:    Colitis and hematemesis.  HPI:   Patient is 33 year old Caucasian female with history of juvenile diabetes mellitus, seizure disorder, GERD and IBS presented to emergency room yesterdaywith acute onset of nausea vomiting and diarrhea. Patient states that her blood glucose levels have been running high for the last 2 weeks. She woke up 6 yesterday morning around 6 AM and had multiple episodes of vomiting and diarrhea.she did notice scant amount of blood in vomitus but denies melena or rectal bleeding. She complains of recurrent left low quadrant abdominal pain which she also notices fullness or lump. She states she was treated with Augmentin for dental problems over 2 months ago. She has been evaluated Dr. Darrick PennaFields for GERD and irritable bowel syndrome.she does get relief with dicyclomine but then she becomes constipated and may go for 1 week without a bowel movement. She is therefore taking medication on when necessary basis. She had been on omeprazole which she discontinued because she thought she was having abdominal pain. She has not experienced fever or chills. She also denies cough shortness of breath or dysuria. She feels she may have lost 5 pounds recently.she remains under a lot of stress as she is in child custody battle with her ex-husband. Presently she is tolerating liquids without any difficulty. She has not had a bowel movement since admission. Prior GI evaluation includes EGD and colonoscopy in October 2013 and more recently on 05/31/2014. On last endoscopic evaluation gastric biopsy was negative for H. Pylori, duodenal biopsy was negative for mucosal disease and colonic biopsy was negative for microscopic colitis.   Past Medical History  Diagnosis Date  . Diabetes mellitus type 1   . Asthma   . IBS (irritable bowel syndrome)   .  GERD (gastroesophageal reflux disease)   . Seizure     Past Surgical History  Procedure Laterality Date  . Appendectomy    . Knee surgery      left  . Tubal ligation    . Wrist surgery      left  . Colonoscopy  08/24/2012    NL COLON Bx  . Esophagogastroduodenoscopy  08/24/2012    NL DUO Bx  . Colonoscopy with propofol N/A 05/31/2014    NL COLON Bx  . Esophagogastroduodenoscopy (egd) with propofol N/A 05/31/2014    GASTRITIS, NL DUO Bx  . Esophageal biopsy N/A 05/31/2014    GASTRIC BX NOT ESOPHAGEAL    Prior to Admission medications   Medication Sig Start Date End Date Taking? Authorizing Provider  dicyclomine (BENTYL) 10 MG capsule Take 10 mg by mouth 2 (two) times daily.   Yes Historical Provider, MD  HYDROcodone-acetaminophen (NORCO/VICODIN) 5-325 MG per tablet Take 1 tablet by mouth every 6 (six) hours as needed for moderate pain.   Yes Historical Provider, MD  insulin lispro (HUMALOG) 100 UNIT/ML injection Inject 1-8 Units into the skin 3 (three) times daily before meals. Sliding Scale.   Yes Historical Provider, MD  insulin lispro protamine-insulin lispro (HUMALOG 75/25) (75-25) 100 UNIT/ML SUSP Inject 14 Units into the skin 2 (two) times daily with a meal.    Yes Historical Provider, MD  LORazepam (ATIVAN) 1 MG tablet Take 1 mg by mouth 2 (two) times daily.   Yes Historical Provider, MD  QUEtiapine (SEROQUEL) 300 MG tablet Take 300 mg by mouth at bedtime.   Yes Historical Provider, MD  LORazepam (  ATIVAN) 1 MG tablet Take 1 tablet (1 mg total) by mouth 2 (two) times daily. Patient not taking: Reported on 09/16/2014 07/13/14   Benny Lennert, MD  omeprazole (PRILOSEC) 20 MG capsule Take 20 mg by mouth daily.    Historical Provider, MD  QUEtiapine (SEROQUEL) 100 MG tablet Take 300 mg by mouth at bedtime.     Historical Provider, MD    Current Facility-Administered Medications  Medication Dose Route Frequency Provider Last Rate Last Dose  . 0.9 %  sodium chloride infusion    Intravenous Continuous Haydee Monica, MD   Stopped at 09/17/14 0206  . antiseptic oral rinse (CPC / CETYLPYRIDINIUM CHLORIDE 0.05%) solution 7 mL  7 mL Mouth Rinse BID Haydee Monica, MD   7 mL at 09/17/14 0832  . ciprofloxacin (CIPRO) IVPB 400 mg  400 mg Intravenous Q12H Haydee Monica, MD   400 mg at 09/17/14 0950  . dextrose 50 % solution 25 mL  25 mL Intravenous PRN Haydee Monica, MD      . HYDROmorphone (DILAUDID) injection 1 mg  1 mg Intravenous Q4H PRN Erick Blinks, MD   1 mg at 09/17/14 0937  . [START ON 09/18/2014] Influenza vac split quadrivalent PF (FLUARIX) injection 0.5 mL  0.5 mL Intramuscular Tomorrow-1000 Rachal A David, MD      . insulin aspart (novoLOG) injection 0-9 Units  0-9 Units Subcutaneous TID WC Erick Blinks, MD      . insulin aspart protamine- aspart (NOVOLOG MIX 70/30) injection 14 Units  14 Units Subcutaneous BID WC Erick Blinks, MD      . LORazepam (ATIVAN) tablet 1 mg  1 mg Oral BID Haydee Monica, MD   1 mg at 09/17/14 0914  . metroNIDAZOLE (FLAGYL) IVPB 500 mg  500 mg Intravenous Q8H Haydee Monica, MD   500 mg at 09/17/14 3244  . pantoprazole (PROTONIX) EC tablet 40 mg  40 mg Oral Daily Erick Blinks, MD   40 mg at 09/17/14 1101  . promethazine (PHENERGAN) injection 12.5 mg  12.5 mg Intravenous Q6H PRN Erick Blinks, MD   12.5 mg at 09/17/14 0940  . QUEtiapine (SEROQUEL) tablet 300 mg  300 mg Oral QHS Haydee Monica, MD   300 mg at 09/17/14 0053    Allergies as of 09/16/2014 - Review Complete 09/16/2014  Allergen Reaction Noted  . Sulfa antibiotics Hives 08/17/2012    Family History  Problem Relation Age of Onset  . Colon cancer Neg Hx   . Diabetes Other   . Stroke Other     History   Social History  . Marital Status: Legally Separated    Spouse Name: N/A    Number of Children: 2  . Years of Education: N/A   Occupational History  . stay at home mom    Social History Main Topics  . Smoking status: Current Every Day Smoker -- 1.50  packs/day for 12 years    Types: Cigarettes  . Smokeless tobacco: Current User  . Alcohol Use: No  . Drug Use: Yes    Special: Marijuana  . Sexual Activity: Yes    Birth Control/ Protection: None   Other Topics Concern  . Not on file   Social History Narrative    Review of Systems: See HPI, otherwise normal ROS  Physical Exam: Temp:  [97.6 F (36.4 C)-98.5 F (36.9 C)] 97.7 F (36.5 C) (11/07 0608) Pulse Rate:  [65-140] 83 (11/07 1230) Resp:  [10-23] 17 (11/07 1230)  BP: (88-170)/(45-85) 110/73 mmHg (11/07 1230) SpO2:  [96 %-100 %] 100 % (11/07 1230) Weight:  [115 lb 8.3 oz (52.4 kg)] 115 lb 8.3 oz (52.4 kg) (11/06 2300) Last BM Date: 09/16/14 Patient is well-developed thin Caucasian female in NAD. She appears very anxious. Conjunctiva is pink. Sclerae nonicteric. Oropharyngeal mucosa is normal. No neck masses or thyromegaly noted. Cardiac exam with regular rhythm normal S1 and S2. No murmur or gallop noted. Lungs are clear to auscultation. Abdomen is symmetrical. Bowel sounds are normal. On palpation abdomen is soft with mild tenderness at LLQ and RUQ. No organomegaly or masses. No peripheral edema or clubbing noted.  Lab Results:  Recent Labs  09/16/14 1523 09/16/14 2335  WBC 11.4* 13.9*  HGB 14.0 12.9  HCT 40.9 38.5  PLT 356 293   BMET  Recent Labs  09/17/14 0122 09/17/14 0356 09/17/14 0617  NA 135* 135* 137  K 3.8 4.3 4.1  CL 100 100 103  CO2 19 19 20   GLUCOSE 212* 162* 188*  BUN 14 12 10   CREATININE 0.53 0.50 0.48*  CALCIUM 8.3* 7.9* 8.0*   LFT  Recent Labs  09/16/14 1523  PROT 8.0  ALBUMIN 4.3  AST 12  ALT 6  ALKPHOS 104  BILITOT 1.4*    Studies/Results: Ct Abdomen Pelvis W Contrast  09/16/2014   CLINICAL DATA:  Left lower quadrant pain, nausea/vomiting, hematemesis  EXAM: CT ABDOMEN AND PELVIS WITH CONTRAST  TECHNIQUE: Multidetector CT imaging of the abdomen and pelvis was performed using the standard protocol following bolus  administration of intravenous contrast.  CONTRAST:  100mL OMNIPAQUE IOHEXOL 300 MG/ML  SOLN  COMPARISON:  05/12/2014  FINDINGS: Lower chest:  Lung bases are clear.  Hepatobiliary: Liver is notable for focal fat/altered perfusion along the falciform ligament.  Gallbladder is unremarkable. No intrahepatic or extrahepatic ductal dilatation.  Pancreas: Within normal limits.  Spleen: Within normal limits.  Adrenals/Urinary Tract: Adrenal glands are unremarkable.  Kidneys are within normal limits.  No hydronephrosis.  Bladder is mildly thick-walled although underdistended.  Stomach/Bowel: Stomach is unremarkable.  No evidence of bowel obstruction.  Prior appendectomy.  Wall thickening predominantly involving the sigmoid colon, correlate for infectious/ inflammatory colitis.  Vascular/Lymphatic: No evidence of abdominal aortic aneurysm.  No suspicious abdominopelvic lymphadenopathy.  Reproductive: Uterus is unremarkable.  Bilateral ovaries are grossly unremarkable.  Other: Trace pelvic ascites.  Musculoskeletal: Visualized osseous structures are within normal limits.  IMPRESSION: Wall thickening involving the sigmoid colon, correlate for infectious/ inflammatory colitis.  No evidence of bowel obstruction.  Prior appendectomy.  Bladder wall thickening, favored to reflect underdistention.   Electronically Signed   By: Charline BillsSriyesh  Krishnan M.D.   On: 09/16/2014 21:40    Assessment;  #1. Sigmoid colon wall thickening. Wall thickening is not very pronounced and possibly due to acute infection given her other symptoms. She has undergone colonoscopy in October 2013 and in July this year and on both occasions there was no evidence of endoscopic or microscopic colitis. Therefore I do not believe she needs further endoscopic evaluation. #2. Hematemesis secondary to Mallory-Weiss tear given history of repeated episodes of vomiting.1 g drop in hemoglobin appear to be due to hydration.patient had EGD on 05/31/2014 revealing small  sliding hiatal hernia.gastric biopsy revealed inactive gastritis and H. Pylori stains were negative. Nausea and vomiting what appeared to be secondary to DKA. #3. Irritable bowel syndrome. She has typical symptoms and is getting relief with dicyclomine. Furthermore colonoscopy with biopsy in October 2013 and in July 2015 were  negative.  Recommendations; Continue pantoprazole at current dose. Agree with empiric therapy with antibiotics while waiting for stool studies. Will consider EGD if she has evidence of recurrent bleed or hemoglobin drops to the point that she needs to be transfused.   LOS: 1 day   Khizar Fiorella U  09/17/2014, 1:11 PM

## 2014-09-18 DIAGNOSIS — R112 Nausea with vomiting, unspecified: Secondary | ICD-10-CM

## 2014-09-18 LAB — GLUCOSE, CAPILLARY
Glucose-Capillary: 137 mg/dL — ABNORMAL HIGH (ref 70–99)
Glucose-Capillary: 199 mg/dL — ABNORMAL HIGH (ref 70–99)

## 2014-09-18 LAB — CBC
HCT: 32.8 % — ABNORMAL LOW (ref 36.0–46.0)
HEMOGLOBIN: 10.9 g/dL — AB (ref 12.0–15.0)
MCH: 29.1 pg (ref 26.0–34.0)
MCHC: 33.2 g/dL (ref 30.0–36.0)
MCV: 87.5 fL (ref 78.0–100.0)
Platelets: 254 10*3/uL (ref 150–400)
RBC: 3.75 MIL/uL — ABNORMAL LOW (ref 3.87–5.11)
RDW: 14.7 % (ref 11.5–15.5)
WBC: 8.2 10*3/uL (ref 4.0–10.5)

## 2014-09-18 LAB — BASIC METABOLIC PANEL
Anion gap: 10 (ref 5–15)
BUN: 8 mg/dL (ref 6–23)
CALCIUM: 8.1 mg/dL — AB (ref 8.4–10.5)
CO2: 23 mEq/L (ref 19–32)
Chloride: 107 mEq/L (ref 96–112)
Creatinine, Ser: 0.52 mg/dL (ref 0.50–1.10)
GFR calc non Af Amer: >90 mL/min (ref >90)
Glucose, Bld: 125 mg/dL — ABNORMAL HIGH (ref 70–99)
POTASSIUM: 3.6 meq/L — AB (ref 3.7–5.3)
SODIUM: 140 meq/L (ref 137–147)

## 2014-09-18 MED ORDER — HYDROCODONE-ACETAMINOPHEN 5-325 MG PO TABS: 1.0000 | ORAL_TABLET | Freq: Four times a day (QID) | ORAL | Status: DC | PRN

## 2014-09-18 MED ORDER — METRONIDAZOLE 250 MG PO TABS: 250.0000 mg | ORAL_TABLET | Freq: Three times a day (TID) | ORAL | Status: DC

## 2014-09-18 MED ORDER — PROMETHAZINE HCL 12.5 MG PO TABS: 12.5000 mg | ORAL_TABLET | Freq: Four times a day (QID) | ORAL | Status: DC | PRN

## 2014-09-18 MED ORDER — METRONIDAZOLE 500 MG PO TABS
250.0000 mg | ORAL_TABLET | Freq: Three times a day (TID) | ORAL | Status: DC
  Administered 2014-09-18: 250 mg via ORAL
  Filled 2014-09-18: qty 1

## 2014-09-18 MED ORDER — POTASSIUM CHLORIDE CRYS ER 20 MEQ PO TBCR: 40.0000 meq | EXTENDED_RELEASE_TABLET | Freq: Once | ORAL | Status: DC

## 2014-09-18 MED ORDER — CIPROFLOXACIN HCL 250 MG PO TABS: 500.0000 mg | ORAL_TABLET | Freq: Two times a day (BID) | ORAL | Status: DC

## 2014-09-18 MED ORDER — CIPROFLOXACIN HCL 500 MG PO TABS: 500.0000 mg | ORAL_TABLET | Freq: Two times a day (BID) | ORAL | Status: DC

## 2014-09-18 NOTE — Discharge Summary (Signed)
Physician Discharge Summary  Kristin [name] ZOX:[id number] DOB: 1981-05-09 DOA: 09/16/2014  PCP: Cassell Smiles., MD  Admit date: 09/16/2014 Discharge date: 09/18/2014  Time spent: 40 minutes  Recommendations for Outpatient Follow-up:  1. Patient will follow up with Dr. Darrick Penna as an outpatient as previously scheduled 2. Follow-up with primary care physician in 1-2 weeks  Discharge Diagnoses:  Principal Problem:   DKA (diabetic ketoacidoses) Active Problems:   Nausea & vomiting   IBS (irritable bowel syndrome)   Diabetes mellitus type I   Hematemesis   Colitis, acute   Discharge Condition: improved  Diet recommendation: low carb  Filed Weights   09/16/14 2300  Weight: 52.4 kg (115 lb 8.3 oz)    History of present illness:  This patient was admitted with nausea, vomiting and abdominal pain. She was found to be in diabetic ketoacidosis and CT scan of the abdomen and pelvis indicated possible colitis. She was admitted to the hospital for further treatments.  Hospital Course:  Regarding her diabetes, she was admitted to the stepdown unit and started on intravenous fluids and insulin infusion per protocol. Her anion gap closed and she was transitioned back to her regular subcutaneous insulin. Her blood sugars have since been stable and she is tolerating a solid diet.  She was seen by gastroenterology for possible colitis on CT scan. She was started on a course of antibiotics. It was felt that her abdominal pain was likely related to her irritable bowel syndrome. She recently had an extensive endoscopy/colonoscopy done which did not show any acute findings. Recommendations were to continue a course of antibiotics and follow-up with GI as an outpatient. The patient is tolerating solid food at this point, not vomiting and does not have any significant diarrhea. The patient is stable for discharge home.  Procedures:    Consultations:  gastroenterology  Discharge Exam: Filed  Vitals:   09/18/14 0649  BP: 120/73  Pulse: 76  Temp: 98.1 F (36.7 C)  Resp: 20    General: no acute distress Cardiovascular: S1, S2, regular rate and rhythm Respiratory: clear to auscultation bilaterally  Discharge Instructions You were cared for by a hospitalist during your hospital stay. If you have any questions about your discharge medications or the care you received while you were in the hospital after you are discharged, you can call the unit and asked to speak with the hospitalist on call if the hospitalist that took care of you is not available. Once you are discharged, your primary care physician will handle any further medical issues. Please note that NO REFILLS for any discharge medications will be authorized once you are discharged, as it is imperative that you return to your primary care physician (or establish a relationship with a primary care physician if you do not have one) for your aftercare needs so that they can reassess your need for medications and monitor your lab values.  Discharge Instructions    Call MD for:  persistant nausea and vomiting    Complete by:  As directed      Call MD for:  severe uncontrolled pain    Complete by:  As directed      Diet - low sodium heart healthy    Complete by:  As directed      Increase activity slowly    Complete by:  As directed           Discharge Medication List as of 09/18/2014  3:14 PM    START taking these medications  Details  ciprofloxacin (CIPRO) 500 MG tablet Take 1 tablet (500 mg total) by mouth 2 (two) times daily., Starting 09/18/2014, Until Discontinued, Print    metroNIDAZOLE (FLAGYL) 250 MG tablet Take 1 tablet (250 mg total) by mouth 3 (three) times daily after meals., Starting 09/18/2014, Until Discontinued, Print    promethazine (PHENERGAN) 12.5 MG tablet Take 1 tablet (12.5 mg total) by mouth every 6 (six) hours as needed for nausea or vomiting., Starting 09/18/2014, Until Discontinued, Print       CONTINUE these medications which have CHANGED   Details  HYDROcodone-acetaminophen (NORCO/VICODIN) 5-325 MG per tablet Take 1-2 tablets by mouth every 6 (six) hours as needed for moderate pain., Starting 09/18/2014, Until Discontinued, Print      CONTINUE these medications which have NOT CHANGED   Details  dicyclomine (BENTYL) 10 MG capsule Take 10 mg by mouth 2 (two) times daily., Until Discontinued, Historical Med    insulin lispro (HUMALOG) 100 UNIT/ML injection Inject 1-8 Units into the skin 3 (three) times daily before meals. Sliding Scale., Until Discontinued, Historical Med    insulin lispro protamine-insulin lispro (HUMALOG 75/25) (75-25) 100 UNIT/ML SUSP Inject 14 Units into the skin 2 (two) times daily with a meal. , Until Discontinued, Historical Med    LORazepam (ATIVAN) 1 MG tablet Take 1 mg by mouth 2 (two) times daily., Until Discontinued, Historical Med    QUEtiapine (SEROQUEL) 300 MG tablet Take 300 mg by mouth at bedtime., Until Discontinued, Historical Med    omeprazole (PRILOSEC) 20 MG capsule Take 20 mg by mouth daily., Until Discontinued, Historical Med       Allergies  Allergen Reactions  . Sulfa Antibiotics Hives   Follow-up Information    Follow up with Cassell SmilesFUSCO,LAWRENCE J., MD. Schedule an appointment as soon as possible for a visit in 2 weeks.   Specialty:  Internal Medicine   Contact information:   [address] [place] [address] [phone]       Follow up with Jonette EvaSandi Fields, MD.   Specialty:  Gastroenterology   Why:  as scheduled   Contact information:   [address] [place] [address] [phone]        The results of significant diagnostics from this hospitalization (including imaging, microbiology, ancillary and laboratory) are listed below for reference.    Significant Diagnostic Studies: Ct Abdomen Pelvis W Contrast  09/16/2014   CLINICAL DATA:  Left lower quadrant pain,  nausea/vomiting, hematemesis  EXAM: CT ABDOMEN AND PELVIS WITH CONTRAST  TECHNIQUE: Multidetector CT imaging of the abdomen and pelvis was performed using the standard protocol following bolus administration of intravenous contrast.  CONTRAST:  100mL OMNIPAQUE IOHEXOL 300 MG/ML  SOLN  COMPARISON:  05/12/2014  FINDINGS: Lower chest:  Lung bases are clear.  Hepatobiliary: Liver is notable for focal fat/altered perfusion along the falciform ligament.  Gallbladder is unremarkable. No intrahepatic or extrahepatic ductal dilatation.  Pancreas: Within normal limits.  Spleen: Within normal limits.  Adrenals/Urinary Tract: Adrenal glands are unremarkable.  Kidneys are within normal limits.  No hydronephrosis.  Bladder is mildly thick-walled although underdistended.  Stomach/Bowel: Stomach is unremarkable.  No evidence of bowel obstruction.  Prior appendectomy.  Wall thickening predominantly involving the sigmoid colon, correlate for infectious/ inflammatory colitis.  Vascular/Lymphatic: No evidence of abdominal aortic aneurysm.  No suspicious abdominopelvic lymphadenopathy.  Reproductive: Uterus is unremarkable.  Bilateral ovaries are grossly unremarkable.  Other: Trace pelvic ascites.  Musculoskeletal: Visualized osseous structures are within normal limits.  IMPRESSION: Wall thickening involving the sigmoid colon, correlate for infectious/ inflammatory colitis.  No evidence of bowel obstruction.  Prior appendectomy.  Bladder wall thickening, favored to reflect underdistention.   Electronically Signed   By: Charline BillsSriyesh  Krishnan M.D.   On: 09/16/2014 21:40    Microbiology: Recent Results (from the past 240 hour(s))  MRSA PCR Screening     Status: None   Collection Time: 09/16/14 11:24 PM  Result Value Ref Range Status   MRSA by PCR NEGATIVE NEGATIVE Final    Comment:        The GeneXpert MRSA Assay (FDA approved for NASAL specimens only), is one component of a comprehensive MRSA colonization surveillance program.  It is not intended to diagnose MRSA infection nor to guide or monitor treatment for MRSA infections.      Labs: Basic Metabolic Panel:  Recent Labs Lab 09/16/14 2335 09/17/14 0122 09/17/14 0356 09/17/14 0617 09/18/14 0559  NA 132* 135* 135* 137 140  K 4.0 3.8 4.3 4.1 3.6*  CL 94* 100 100 103 107  CO2 13* 19 19 20 23   GLUCOSE 409* 212* 162* 188* 125*  BUN 16 14 12 10 8   CREATININE 0.50 0.53 0.50 0.48* 0.52  CALCIUM 8.2* 8.3* 7.9* 8.0* 8.1*   Liver Function Tests:  Recent Labs Lab 09/16/14 1523  AST 12  ALT 6  ALKPHOS 104  BILITOT 1.4*  PROT 8.0  ALBUMIN 4.3    Recent Labs Lab 09/16/14 2026  LIPASE 11   No results for input(s): AMMONIA in the last 168 hours. CBC:  Recent Labs Lab 09/16/14 1523 09/16/14 2335 09/18/14 0559  WBC 11.4* 13.9* 8.2  NEUTROABS 9.4*  --   --   HGB 14.0 12.9 10.9*  HCT 40.9 38.5 32.8*  MCV 85.4 87.5 87.5  PLT 356 293 254   Cardiac Enzymes:  Recent Labs Lab 09/16/14 1523  TROPONINI <0.30   BNP: BNP (last 3 results) No results for input(s): PROBNP in the last 8760 hours. CBG:  Recent Labs Lab 09/17/14 1430 09/17/14 1602 09/17/14 2107 09/18/14 0740 09/18/14 1118  GLUCAP 138* 125* 114* 137* 199*       Signed:  Gennifer Potenza  [facility] 09/18/2014, 7:23 PM

## 2014-09-18 NOTE — Progress Notes (Signed)
Pt discharged home today per Dr. Kerry HoughMemon. Pt's IV site D/C'd and WDL. Pt's VSS. Pt provided with home medication list, discharge instructions and prescriptions. Verbalized understanding. Pt ambulated off floor in stable condition accompanied by RN and boyfriend.

## 2014-09-18 NOTE — Progress Notes (Signed)
Subjective; Patient feels much better. She is able to keep her food down. She continues to complain of pain and fullness in left lower quadrant for abdomen. She had one bowel movement yesterday but none today. She denies nausea or vomiting.  Objective; BP 120/73 mmHg  Pulse 76  Temp(Src) 98.1 F (36.7 C) (Oral)  Resp 20  Ht 5\' 2"  (1.575 m)  Wt 115 lb 8.3 oz (52.4 kg)  BMI 21.12 kg/m2  SpO2 100%  LMP 08/26/2014 Patient is alert and appears to be comfortable. Abdomen is symmetrical with normal bowel sounds. It is soft with mild tenderness at LLQ and left mid abdomen. No guarding or rebound noted. No organomegaly or masses.  Lab data; WBC 8.2, H&H 10.9 and 32.8 and platelet count 254K Serum sodium 140, potassium 3.6, chloride 107, CO2 23, glucose 125, BUN 8, creatinine 0.52. GI pathogen panel is pending.  Assessment; Nausea vomiting has resolved. It was primarily secondary to DKA. Acute on chronic diarrhea most likely secondary to infectious colitis. She is improving with empiric antibiotic therapy. Chronic left-sided abdominal pain secondary to IBS. She has undergone extensive evaluation by Dr. Darrick PennaFields. No evidence of active bleed. Drop in H&H secondary to hydration.  Recommendations;  Will switch to Cipro and metronidazole by mouth.Will complete one week of therapy.

## 2014-09-19 LAB — GI PATHOGEN PANEL BY PCR, STOOL
C difficile toxin A/B: POSITIVE
CRYPTOSPORIDIUM BY PCR: NEGATIVE
Campylobacter by PCR: NEGATIVE
E COLI 0157 BY PCR: NEGATIVE
E coli (ETEC) LT/ST: NEGATIVE
E coli (STEC): NEGATIVE
G lamblia by PCR: NEGATIVE
NOROVIRUS G1/G2: NEGATIVE
Rotavirus A by PCR: NEGATIVE
SALMONELLA BY PCR: NEGATIVE
SHIGELLA BY PCR: NEGATIVE

## 2014-09-19 LAB — OCCULT BLOOD, POC DEVICE: FECAL OCCULT BLD: NEGATIVE

## 2014-09-22 NOTE — Care Management Utilization Note (Signed)
UR completed 

## 2014-10-13 NOTE — Telephone Encounter (Signed)
REVIEWED.  

## 2014-12-13 ENCOUNTER — Emergency Department (HOSPITAL_COMMUNITY): Payer: Self-pay

## 2014-12-13 ENCOUNTER — Encounter (HOSPITAL_COMMUNITY): Payer: Self-pay

## 2014-12-13 ENCOUNTER — Emergency Department (HOSPITAL_COMMUNITY)
Admission: EM | Admit: 2014-12-13 | Discharge: 2014-12-13 | Disposition: A | Discharge status: Home / Self Care | Payer: Self-pay | Attending: Emergency Medicine | Admitting: Emergency Medicine

## 2014-12-13 DIAGNOSIS — J45909 Unspecified asthma, uncomplicated: Secondary | ICD-10-CM | POA: Insufficient documentation

## 2014-12-13 DIAGNOSIS — Z720 Tobacco use: Secondary | ICD-10-CM | POA: Insufficient documentation

## 2014-12-13 DIAGNOSIS — Z794 Long term (current) use of insulin: Secondary | ICD-10-CM | POA: Insufficient documentation

## 2014-12-13 DIAGNOSIS — Z9049 Acquired absence of other specified parts of digestive tract: Secondary | ICD-10-CM | POA: Insufficient documentation

## 2014-12-13 DIAGNOSIS — Z9851 Tubal ligation status: Secondary | ICD-10-CM | POA: Insufficient documentation

## 2014-12-13 DIAGNOSIS — R109 Unspecified abdominal pain: Secondary | ICD-10-CM

## 2014-12-13 DIAGNOSIS — Z79899 Other long term (current) drug therapy: Secondary | ICD-10-CM | POA: Insufficient documentation

## 2014-12-13 DIAGNOSIS — K219 Gastro-esophageal reflux disease without esophagitis: Secondary | ICD-10-CM | POA: Insufficient documentation

## 2014-12-13 DIAGNOSIS — G40909 Epilepsy, unspecified, not intractable, without status epilepticus: Secondary | ICD-10-CM | POA: Insufficient documentation

## 2014-12-13 DIAGNOSIS — R52 Pain, unspecified: Secondary | ICD-10-CM

## 2014-12-13 DIAGNOSIS — E119 Type 2 diabetes mellitus without complications: Secondary | ICD-10-CM | POA: Insufficient documentation

## 2014-12-13 DIAGNOSIS — R1013 Epigastric pain: Secondary | ICD-10-CM | POA: Insufficient documentation

## 2014-12-13 LAB — COMPREHENSIVE METABOLIC PANEL
ALT: 10 U/L (ref 0–35)
AST: 14 U/L (ref 0–37)
Albumin: 4.5 g/dL (ref 3.5–5.2)
Alkaline Phosphatase: 88 U/L (ref 39–117)
Anion gap: 8 (ref 5–15)
BUN: 16 mg/dL (ref 6–23)
CHLORIDE: 102 mmol/L (ref 96–112)
CO2: 25 mmol/L (ref 19–32)
Calcium: 9.2 mg/dL (ref 8.4–10.5)
Creatinine, Ser: 0.73 mg/dL (ref 0.50–1.10)
GFR calc non Af Amer: >90 mL/min (ref >90)
Glucose, Bld: 155 mg/dL — ABNORMAL HIGH (ref 70–99)
POTASSIUM: 3.3 mmol/L — AB (ref 3.5–5.1)
SODIUM: 135 mmol/L (ref 135–145)
Total Bilirubin: 1.3 mg/dL — ABNORMAL HIGH (ref 0.3–1.2)
Total Protein: 7.7 g/dL (ref 6.0–8.3)

## 2014-12-13 LAB — CBC WITH DIFFERENTIAL/PLATELET
BASOS PCT: 0 % (ref 0–1)
Basophils Absolute: 0 10*3/uL (ref 0.0–0.1)
Eosinophils Absolute: 0.2 10*3/uL (ref 0.0–0.7)
Eosinophils Relative: 1 % (ref 0–5)
HCT: 42.3 % (ref 36.0–46.0)
Hemoglobin: 14.3 g/dL (ref 12.0–15.0)
LYMPHS PCT: 30 % (ref 12–46)
Lymphs Abs: 3.8 10*3/uL (ref 0.7–4.0)
MCH: 29.8 pg (ref 26.0–34.0)
MCHC: 33.8 g/dL (ref 30.0–36.0)
MCV: 88.1 fL (ref 78.0–100.0)
Monocytes Absolute: 0.8 10*3/uL (ref 0.1–1.0)
Monocytes Relative: 6 % (ref 3–12)
Neutro Abs: 7.8 10*3/uL — ABNORMAL HIGH (ref 1.7–7.7)
Neutrophils Relative %: 63 % (ref 43–77)
Platelets: 367 10*3/uL (ref 150–400)
RBC: 4.8 MIL/uL (ref 3.87–5.11)
RDW: 14.5 % (ref 11.5–15.5)
WBC: 12.5 10*3/uL — ABNORMAL HIGH (ref 4.0–10.5)

## 2014-12-13 LAB — I-STAT BETA HCG BLOOD, ED (MC, WL, AP ONLY): I-stat hCG, quantitative: <5 m[IU]/mL (ref <5)

## 2014-12-13 LAB — CBG MONITORING, ED: Glucose-Capillary: 150 mg/dL — ABNORMAL HIGH (ref 70–99)

## 2014-12-13 LAB — LIPASE, BLOOD: Lipase: 15 U/L (ref 11–59)

## 2014-12-13 MED ORDER — HYDROCODONE-ACETAMINOPHEN 5-325 MG PO TABS: 1.0000 | ORAL_TABLET | Freq: Four times a day (QID) | ORAL | Status: DC | PRN

## 2014-12-13 MED ORDER — ONDANSETRON HCL 4 MG/2ML IJ SOLN
4.0000 mg | Freq: Once | INTRAMUSCULAR | Status: AC
  Administered 2014-12-13: 4 mg via INTRAVENOUS
  Filled 2014-12-13: qty 2

## 2014-12-13 MED ORDER — SODIUM CHLORIDE 0.9 % IV BOLUS (SEPSIS)
2000.0000 mL | Freq: Once | INTRAVENOUS | Status: AC
  Administered 2014-12-13: 2000 mL via INTRAVENOUS

## 2014-12-13 MED ORDER — HYDROMORPHONE HCL 1 MG/ML IJ SOLN
1.0000 mg | Freq: Once | INTRAMUSCULAR | Status: AC
Start: 2014-12-13 — End: 2014-12-13
  Administered 2014-12-13: 1 mg via INTRAVENOUS
  Filled 2014-12-13: qty 1

## 2014-12-13 MED ORDER — ONDANSETRON 4 MG PO TBDP
ORAL_TABLET | ORAL | Status: DC
Start: 2014-12-13 — End: 2016-09-17

## 2014-12-13 MED ORDER — SODIUM CHLORIDE 0.9 % IJ SOLN
INTRAMUSCULAR | Status: AC
  Administered 2014-12-13: 07:00:00
  Filled 2014-12-13: qty 30

## 2014-12-13 MED ORDER — LORAZEPAM 2 MG/ML IJ SOLN
0.5000 mg | Freq: Once | INTRAMUSCULAR | Status: AC
  Administered 2014-12-13: 0.5 mg via INTRAVENOUS
  Filled 2014-12-13: qty 1

## 2014-12-13 NOTE — ED Notes (Signed)
Abd pain for several days, states has been constipated as well, vomiting.

## 2014-12-13 NOTE — Discharge Instructions (Signed)
Follow up with your stomach md this week or next

## 2014-12-13 NOTE — ED Provider Notes (Signed)
CSN: [id number]     Arrival date & time 12/13/14  0510 History   First MD Initiated Contact with Patient 12/13/14 [phone]     Chief Complaint  Patient presents with  . Abdominal Pain     (Consider location/radiation/quality/duration/timing/severity/associated sxs/prior Treatment) Patient is a 33 y.o. female presenting with abdominal pain. The history is provided by the patient (the pt complains of abd pain.  recurrent).  Abdominal Pain Pain location:  Epigastric Pain quality: aching   Pain radiates to:  Does not radiate Pain severity:  Moderate Onset quality:  Gradual Timing:  Constant Progression:  Worsening Chronicity:  Recurrent Context: not awakening from sleep   Associated symptoms: no chest pain, no cough, no diarrhea, no fatigue and no hematuria     Past Medical History  Diagnosis Date  . Diabetes mellitus type 1   . Asthma   . IBS (irritable bowel syndrome)   . GERD (gastroesophageal reflux disease)   . Seizure    Past Surgical History  Procedure Laterality Date  . Appendectomy    . Knee surgery      left  . Tubal ligation    . Wrist surgery      left  . Colonoscopy  08/24/2012    NL COLON Bx  . Esophagogastroduodenoscopy  08/24/2012    NL DUO Bx  . Colonoscopy with propofol N/A 05/31/2014    NL COLON Bx  . Esophagogastroduodenoscopy (egd) with propofol N/A 05/31/2014    GASTRITIS, NL DUO Bx  . Esophageal biopsy N/A 05/31/2014    GASTRIC BX NOT ESOPHAGEAL   Family History  Problem Relation Age of Onset  . Colon cancer Neg Hx   . Diabetes Other   . Stroke Other    History  Substance Use Topics  . Smoking status: Current Every Day Smoker -- 1.50 packs/day for 12 years    Types: Cigarettes  . Smokeless tobacco: Current User  . Alcohol Use: No   OB History    Gravida Para Term Preterm AB TAB SAB Ectopic Multiple Living   Review of Systems  Constitutional: Negative for appetite change and fatigue.  HENT: Negative for congestion, ear  discharge and sinus pressure.   Eyes: Negative for discharge.  Respiratory: Negative for cough.   Cardiovascular: Negative for chest pain.  Gastrointestinal: Positive for abdominal pain. Negative for diarrhea.  Genitourinary: Negative for frequency and hematuria.  Musculoskeletal: Negative for back pain.  Skin: Negative for rash.  Neurological: Negative for seizures and headaches.  Psychiatric/Behavioral: Negative for hallucinations.      Allergies  Sulfa antibiotics  Home Medications   Prior to Admission medications   Medication Sig Start Date End Date Taking? Authorizing Provider  clonazePAM (KLONOPIN) 1 MG tablet Take 1 mg by mouth 3 (three) times daily as needed for anxiety.   Yes Historical Provider, MD  dicyclomine (BENTYL) 10 MG capsule Take 10 mg by mouth 2 (two) times daily.   Yes Historical Provider, MD  insulin lispro (HUMALOG) 100 UNIT/ML injection Inject 1-8 Units into the skin 3 (three) times daily before meals. Sliding Scale.   Yes Historical Provider, MD  insulin lispro protamine-insulin lispro (HUMALOG 75/25) (75-25) 100 UNIT/ML SUSP Inject 14 Units into the skin 2 (two) times daily with a meal.    Yes Historical Provider, MD  QUEtiapine (SEROQUEL) 300 MG tablet Take 300 mg by mouth at bedtime.   Yes Historical Provider, MD  ciprofloxacin (  CIPRO) 500 MG tablet Take 1 tablet (500 mg total) by mouth 2 (two) times daily. 09/18/14   Erick Blinks, MD  HYDROcodone-acetaminophen (NORCO/VICODIN) 5-325 MG per tablet Take 1 tablet by mouth every 6 (six) hours as needed for moderate pain. 12/13/14   Benny Lennert, MD  LORazepam (ATIVAN) 1 MG tablet Take 1 mg by mouth 2 (two) times daily.    Historical Provider, MD  metroNIDAZOLE (FLAGYL) 250 MG tablet Take 1 tablet (250 mg total) by mouth 3 (three) times daily after meals. 09/18/14   Erick Blinks, MD  omeprazole (PRILOSEC) 20 MG capsule Take 20 mg by mouth daily.    Historical Provider, MD  ondansetron (ZOFRAN ODT) 4 MG  disintegrating tablet  ODT q4 hours prn nausea/vomit 12/13/14   Benny Lennert, MD  promethazine (PHENERGAN) 12.5 MG tablet Take 1 tablet (12.5 mg total) by mouth every 6 (six) hours as needed for nausea or vomiting. 09/18/14   Erick Blinks, MD   BP 123/88 mmHg  Pulse 90  Temp(Src) 98.3 F (36.8 C) (Oral)  Resp 18  SpO2 100%  LMP 11/23/2014 Physical Exam  Constitutional: She is oriented to person, place, and time. She appears well-developed.  HENT:  Head: Normocephalic.  Eyes: Conjunctivae and EOM are normal. No scleral icterus.  Neck: Neck supple. No thyromegaly present.  Cardiovascular: Normal rate and regular rhythm.  Exam reveals no gallop and no friction rub.   No murmur heard. Pulmonary/Chest: No stridor. She has no wheezes. She has no rales. She exhibits no tenderness.  Abdominal: She exhibits no distension. There is tenderness. There is no rebound.  Mild tenderness throughout abd  Musculoskeletal: Normal range of motion. She exhibits no edema.  Lymphadenopathy:    She has no cervical adenopathy.  Neurological: She is oriented to person, place, and time. She exhibits normal muscle tone. Coordination normal.  Skin: No rash noted. No erythema.  Psychiatric: She has a normal mood and affect. Her behavior is normal.    ED Course  Procedures (including critical care time) Labs Review Labs Reviewed  CBC WITH DIFFERENTIAL/PLATELET - Abnormal; Notable for the following:    WBC 12.5 (*)    Neutro Abs 7.8 (*)    All other components within normal limits  COMPREHENSIVE METABOLIC PANEL - Abnormal; Notable for the following:    Potassium 3.3 (*)    Glucose, Bld 155 (*)    Total Bilirubin 1.3 (*)    All other components within normal limits  CBG MONITORING, ED - Abnormal; Notable for the following:    Glucose-Capillary 150 (*)    All other components within normal limits  LIPASE, BLOOD  I-STAT BETA HCG BLOOD, ED ([facility], [facility], [facility] ONLY)  I-STAT BETA HCG BLOOD, ED ([facility], [facility], [facility] ONLY)     Imaging Review Ct Renal Stone Study  12/13/2014   CLINICAL DATA:  Left-sided abdominal pain. Chronic pain, increasing today.  EXAM: CT ABDOMEN AND PELVIS WITHOUT CONTRAST  TECHNIQUE: Multidetector CT imaging of the abdomen and pelvis was performed following the standard protocol without IV contrast.  COMPARISON:  CT 09/16/2014.  FINDINGS: The included lung bases are clear.  The kidneys are symmetric in size without stones or hydronephrosis. There is no perinephric stranding. Both ureters are decompressed without stones along the course.  The unenhanced liver, gallbladder, spleen, pancreas, and adrenal glands are normal. Limited bowel assessment given lack of oral contrast, no definite bowel wall thickening. Surgical clips at the base of the cecum from appendectomy. The abdominal aorta  is normal in caliber. There is no retroperitoneal adenopathy.  Within the pelvis the bladder is decompressed and not well evaluated. Uterus appears mildly bulbous. There is questionable soft tissue stranding in the deep pelvis and adnexa. Tubal ligation clips are seen. There is surgical clips in the left anterior omental fat, unchanged.  There are no acute or suspicious osseous abnormalities.  IMPRESSION: 1.  No renal stones or obstructive uropathy. 2. Questionable soft tissue stranding in the deep pelvis and adnexa, findings can be seen in the setting of pelvic inflammatory disease. Correlation with physical exam recommended.   Electronically Signed   By: Rubye OaksMelanie  Ehinger M.D.   On: 12/13/2014 07:06     EKG Interpretation None      MDM   Final diagnoses:  Pain  Abdominal pain in female    Pt with chronic abd pain,   Will tx symptoms and continue prilosec and follow up with gi    Benny LennertJoseph L Romone Shaff, MD 12/13/14 2303

## 2016-02-19 IMAGING — CT CT ABD-PELV W/ CM
2 of 4 series · 16 of 46 positions shown, 18 images · IV contrast (Omnipaque 300)
Comparison: 04/02/2014

CLINICAL DATA: Pain.  Nausea.  Diarrhea.

EXAM:
CT ABDOMEN AND PELVIS WITH CONTRAST
TECHNIQUE: Multidetector CT imaging of the abdomen and pelvis was performed
using the standard protocol following bolus administration of
intravenous contrast.
CONTRAST:  50mL OMNIPAQUE IOHEXOL 300 MG/ML SOLN, 100mL OMNIPAQUE
IOHEXOL 300 MG/ML SOLN

[Series 2: abd_pel_with 5.0 b40f · axial · 0.64mm/px · z∈[-475,-75]mm · 13 of 88 slices shown, 15 images]
[im 4/88  soft-tissue]
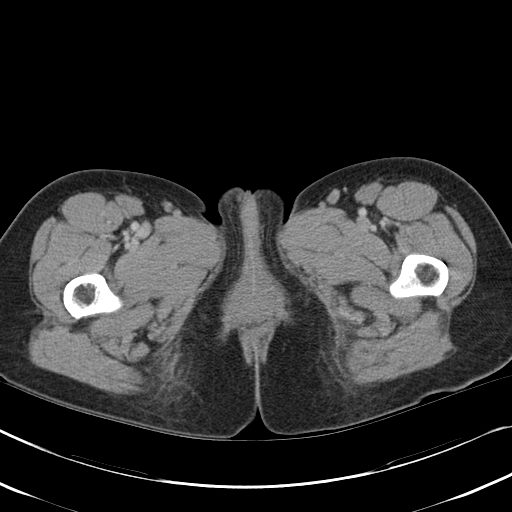
[im 4/88  bone]
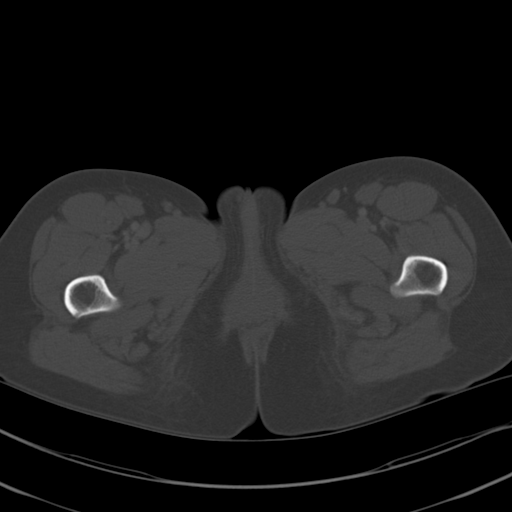
[im 11/88  soft-tissue]
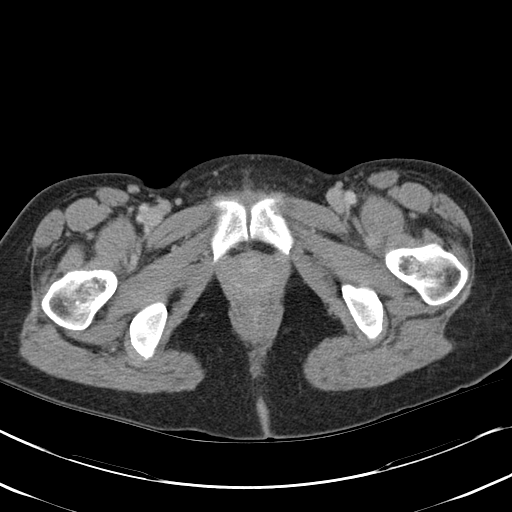
[im 19/88  soft-tissue]
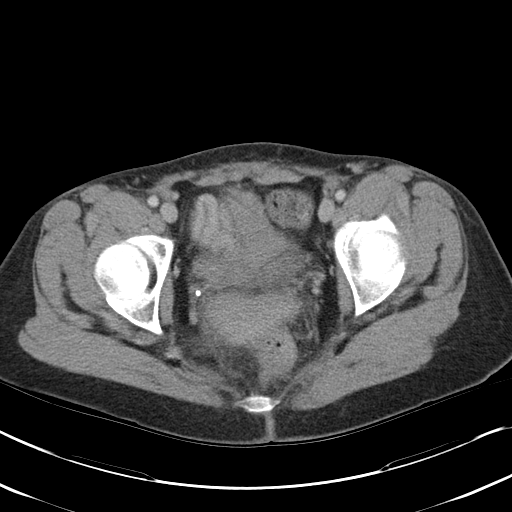
[im 26/88  soft-tissue]
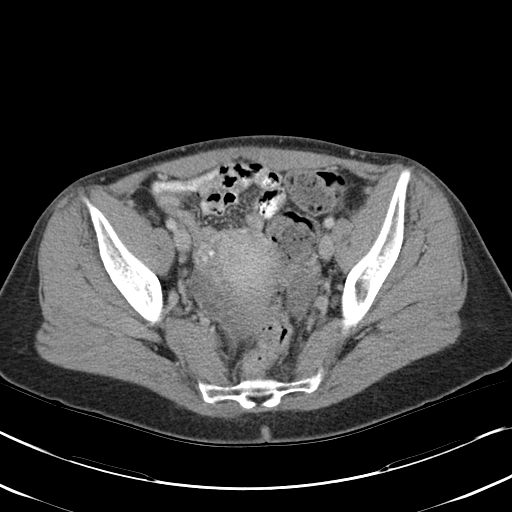
[im 30/88  soft-tissue]
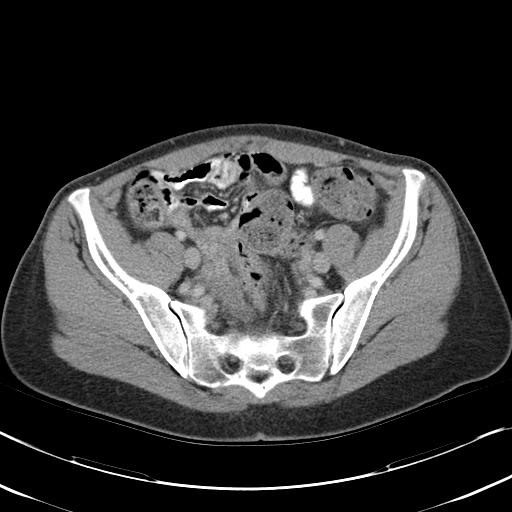
[im 37/88  soft-tissue]
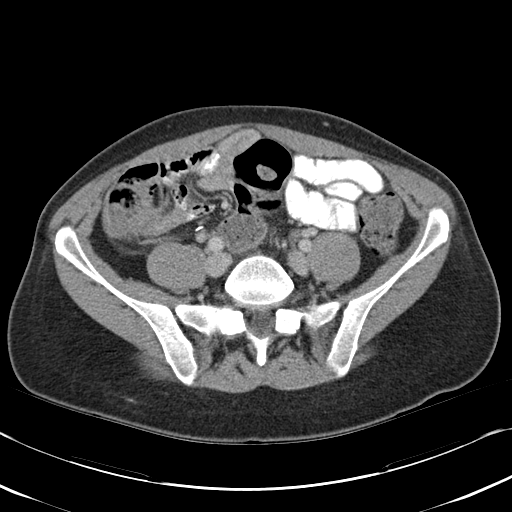
[im 44/88  soft-tissue]
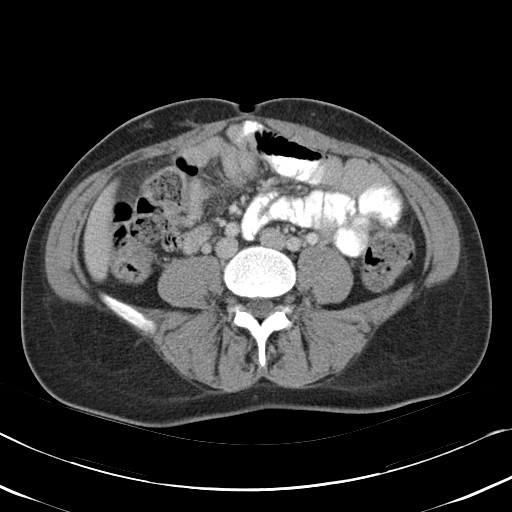
[im 51/88  soft-tissue]
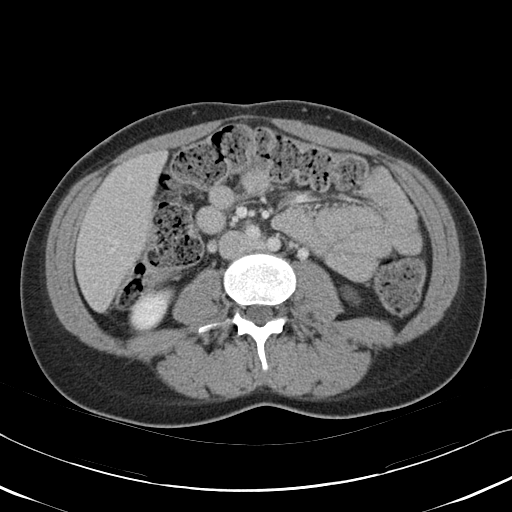
[im 59/88  soft-tissue]
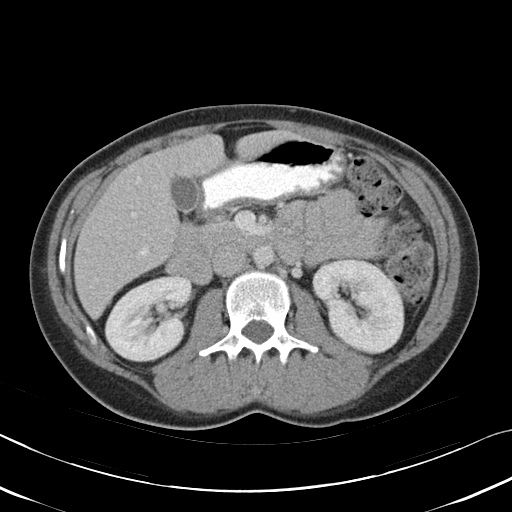
[im 59/88  bone]
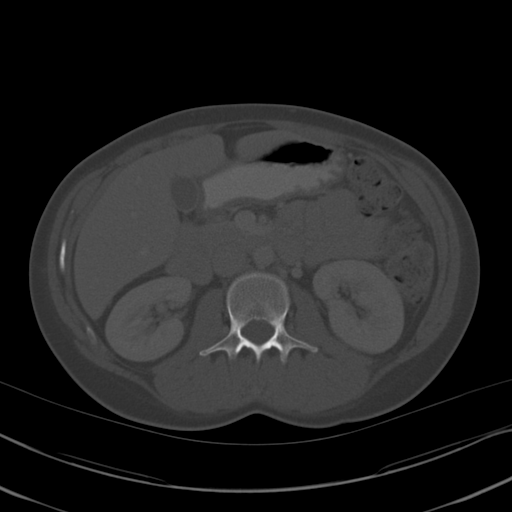
[im 62/88  soft-tissue]
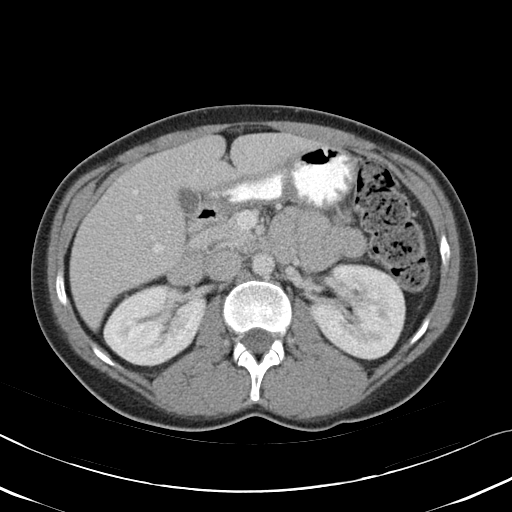
[im 69/88  soft-tissue]
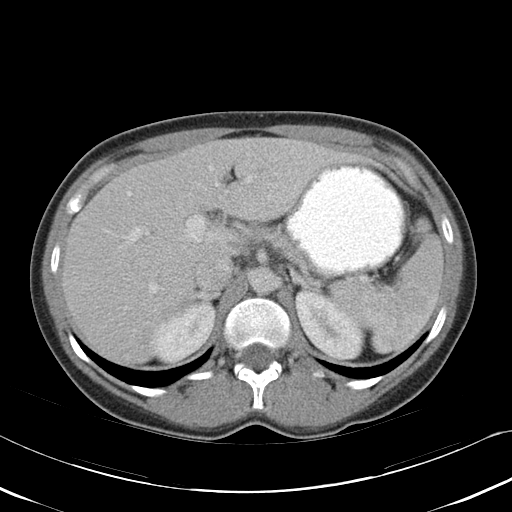
[im 77/88  soft-tissue]
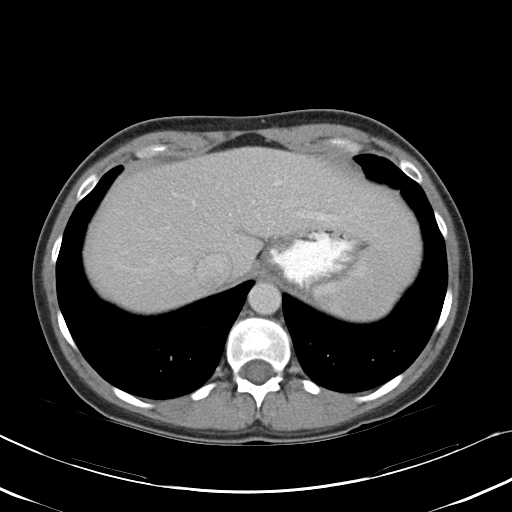
[im 84/88  soft-tissue]
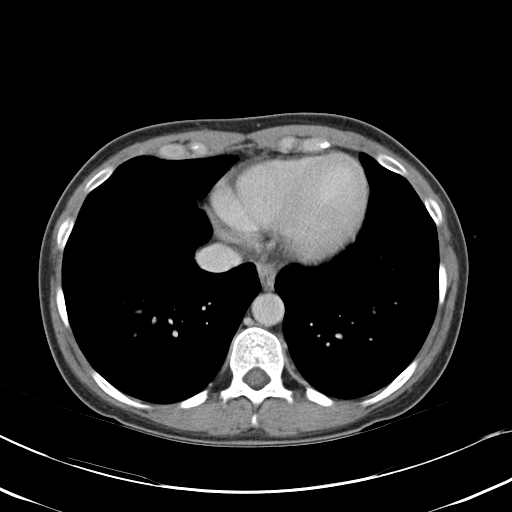

[Series 5: abd_pel_with 3.0 spo cor · coronal · 0.59mm/px · 3 of 71 slices shown]
[im 24/71  soft-tissue]
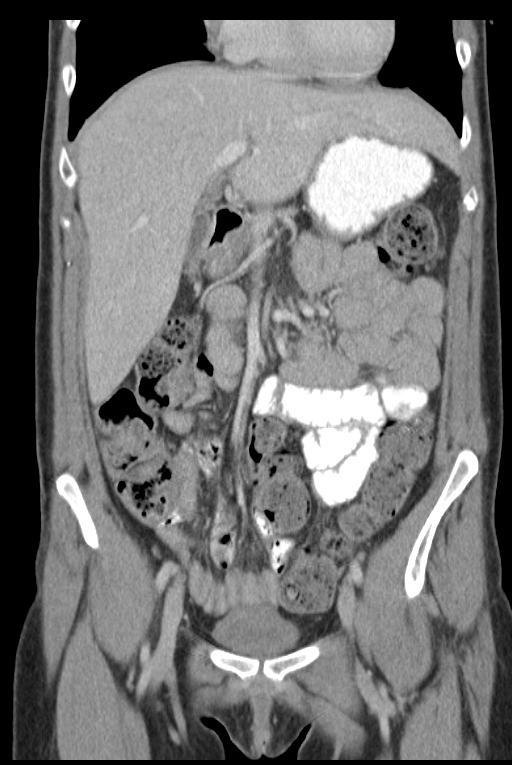
[im 32/71  soft-tissue]
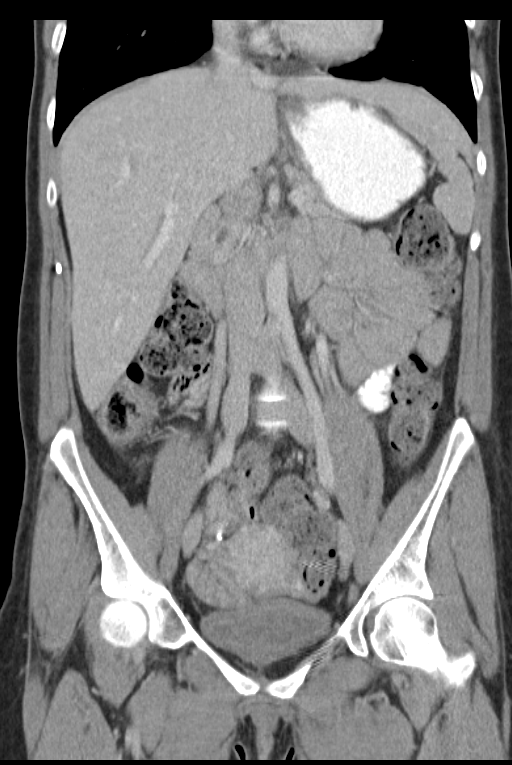
[im 39/71  soft-tissue]
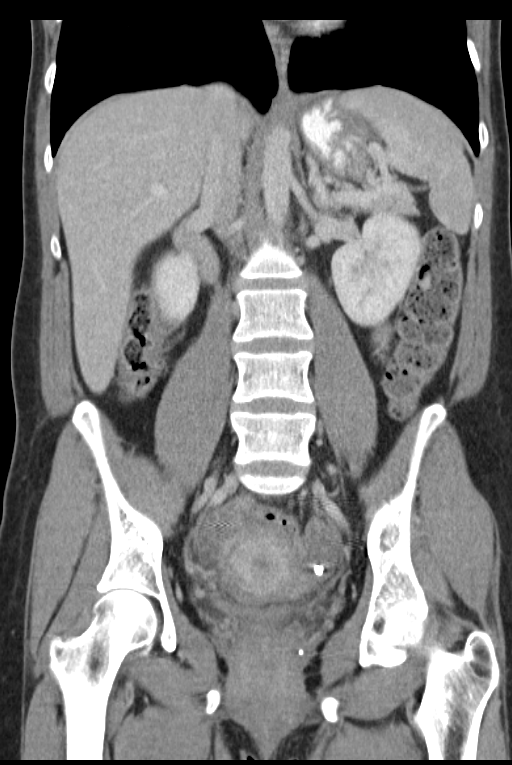

[16 of 46 positions shown; findings below may reference images not displayed]

FINDINGS: The lung bases are clear.  No pleural or pericardial effusion.

No focal liver abnormality. The gallbladder appears normal. The
common bile duct is mildly increased in caliber measuring up to 7
mm. The pancreas appears normal. The spleen is unremarkable.

The adrenal glands both appear normal. Normal appearance of the
kidneys. The urinary bladder appears normal. The uterus and the
adnexal structures have a normal physiologic appearance.

Normal caliber of the abdominal aorta. No aneurysm. There is no
upper abdominal adenopathy. No pelvic or inguinal adenopathy.

The stomach is normal. The small bowel loops have a normal course
and caliber. No obstruction. Previous appendectomy. Normal
appearance of the colon.

No free fluid or abnormal fluid collections identified within the
abdomen or pelvis.

Review of the visualized osseous structures is on unremarkable.
IMPRESSION: 1. No acute findings identified within the abdomen or pelvis.
2. Mild increase caliber of the common bile duct measuring 7 mm.

## 2016-09-17 ENCOUNTER — Emergency Department (HOSPITAL_COMMUNITY): Payer: Self-pay

## 2016-09-17 ENCOUNTER — Encounter (HOSPITAL_COMMUNITY): Payer: Self-pay | Admitting: Emergency Medicine

## 2016-09-17 ENCOUNTER — Observation Stay (HOSPITAL_COMMUNITY)
Admission: EM | Admit: 2016-09-17 | Discharge: 2016-09-18 | Disposition: A | Discharge status: Home / Self Care | Payer: Self-pay | Attending: Internal Medicine | Admitting: Internal Medicine

## 2016-09-17 DIAGNOSIS — F319 Bipolar disorder, unspecified: Secondary | ICD-10-CM

## 2016-09-17 DIAGNOSIS — G894 Chronic pain syndrome: Secondary | ICD-10-CM | POA: Diagnosis present

## 2016-09-17 DIAGNOSIS — E1165 Type 2 diabetes mellitus with hyperglycemia: Secondary | ICD-10-CM | POA: Diagnosis present

## 2016-09-17 DIAGNOSIS — J45909 Unspecified asthma, uncomplicated: Secondary | ICD-10-CM | POA: Insufficient documentation

## 2016-09-17 DIAGNOSIS — E1110 Type 2 diabetes mellitus with ketoacidosis without coma: Secondary | ICD-10-CM | POA: Diagnosis present

## 2016-09-17 DIAGNOSIS — N390 Urinary tract infection, site not specified: Secondary | ICD-10-CM | POA: Diagnosis present

## 2016-09-17 DIAGNOSIS — R109 Unspecified abdominal pain: Secondary | ICD-10-CM | POA: Diagnosis present

## 2016-09-17 DIAGNOSIS — F1910 Other psychoactive substance abuse, uncomplicated: Secondary | ICD-10-CM | POA: Diagnosis present

## 2016-09-17 DIAGNOSIS — F17210 Nicotine dependence, cigarettes, uncomplicated: Secondary | ICD-10-CM | POA: Insufficient documentation

## 2016-09-17 DIAGNOSIS — Z79899 Other long term (current) drug therapy: Secondary | ICD-10-CM | POA: Insufficient documentation

## 2016-09-17 DIAGNOSIS — E1365 Other specified diabetes mellitus with hyperglycemia: Secondary | ICD-10-CM

## 2016-09-17 DIAGNOSIS — E109 Type 1 diabetes mellitus without complications: Secondary | ICD-10-CM | POA: Diagnosis present

## 2016-09-17 DIAGNOSIS — Z720 Tobacco use: Secondary | ICD-10-CM | POA: Diagnosis present

## 2016-09-17 DIAGNOSIS — E1310 Other specified diabetes mellitus with ketoacidosis without coma: Principal | ICD-10-CM

## 2016-09-17 DIAGNOSIS — Z794 Long term (current) use of insulin: Secondary | ICD-10-CM | POA: Insufficient documentation

## 2016-09-17 DIAGNOSIS — IMO0002 Reserved for concepts with insufficient information to code with codable children: Secondary | ICD-10-CM | POA: Diagnosis present

## 2016-09-17 HISTORY — DX: Bipolar disorder, unspecified: F31.9

## 2016-09-17 LAB — HEPATIC FUNCTION PANEL
ALT: 12 U/L — ABNORMAL LOW (ref 14–54)
AST: 17 U/L (ref 15–41)
Albumin: 4.2 g/dL (ref 3.5–5.0)
Alkaline Phosphatase: 62 U/L (ref 38–126)
BILIRUBIN DIRECT: 0.3 mg/dL (ref 0.1–0.5)
BILIRUBIN INDIRECT: 2 mg/dL — AB (ref 0.3–0.9)
Total Bilirubin: 2.3 mg/dL — ABNORMAL HIGH (ref 0.3–1.2)
Total Protein: 7.1 g/dL (ref 6.5–8.1)

## 2016-09-17 LAB — URINALYSIS, ROUTINE W REFLEX MICROSCOPIC
Glucose, UA: >1000 mg/dL — AB
Nitrite: POSITIVE — AB
Specific Gravity, Urine: 1.025 (ref 1.005–1.030)
pH: 5 (ref 5.0–8.0)

## 2016-09-17 LAB — RAPID URINE DRUG SCREEN, HOSP PERFORMED
AMPHETAMINES: NOT DETECTED
BARBITURATES: NOT DETECTED
BENZODIAZEPINES: NOT DETECTED
Cocaine: NOT DETECTED
Opiates: POSITIVE — AB
Tetrahydrocannabinol: POSITIVE — AB

## 2016-09-17 LAB — URINE MICROSCOPIC-ADD ON

## 2016-09-17 LAB — CBC
HCT: 41.8 % (ref 36.0–46.0)
Hemoglobin: 14 g/dL (ref 12.0–15.0)
MCH: 31.9 pg (ref 26.0–34.0)
MCHC: 33.5 g/dL (ref 30.0–36.0)
MCV: 95.2 fL (ref 78.0–100.0)
PLATELETS: 427 10*3/uL — AB (ref 150–400)
RBC: 4.39 MIL/uL (ref 3.87–5.11)
RDW: 13.1 % (ref 11.5–15.5)
WBC: 24 10*3/uL — ABNORMAL HIGH (ref 4.0–10.5)

## 2016-09-17 LAB — LIPASE, BLOOD: Lipase: 10 U/L — ABNORMAL LOW (ref 11–51)

## 2016-09-17 LAB — CBG MONITORING, ED
GLUCOSE-CAPILLARY: 396 mg/dL — AB (ref 65–99)
GLUCOSE-CAPILLARY: 300 mg/dL — AB (ref 65–99)
Glucose-Capillary: 367 mg/dL — ABNORMAL HIGH (ref 65–99)
Glucose-Capillary: 316 mg/dL — ABNORMAL HIGH (ref 65–99)

## 2016-09-17 LAB — GLUCOSE, CAPILLARY: Glucose-Capillary: 170 mg/dL — ABNORMAL HIGH (ref 65–99)

## 2016-09-17 LAB — BASIC METABOLIC PANEL
ANION GAP: 18 — AB (ref 5–15)
BUN: 25 mg/dL — ABNORMAL HIGH (ref 6–20)
CHLORIDE: 90 mmol/L — AB (ref 101–111)
CO2: 19 mmol/L — ABNORMAL LOW (ref 22–32)
CREATININE: 0.92 mg/dL (ref 0.44–1.00)
Calcium: 8.3 mg/dL — ABNORMAL LOW (ref 8.9–10.3)
GFR calc non Af Amer: >60 mL/min (ref >60)
Glucose, Bld: 424 mg/dL — ABNORMAL HIGH (ref 65–99)
Potassium: 4.4 mmol/L (ref 3.5–5.1)
Sodium: 127 mmol/L — ABNORMAL LOW (ref 135–145)

## 2016-09-17 LAB — PREGNANCY, URINE: PREG TEST UR: NEGATIVE

## 2016-09-17 MED ORDER — ONDANSETRON HCL 4 MG PO TABS: 4.0000 mg | ORAL_TABLET | Freq: Four times a day (QID) | ORAL | Status: DC | PRN

## 2016-09-17 MED ORDER — SODIUM CHLORIDE 0.9 % IV BOLUS (SEPSIS)
1000.0000 mL | Freq: Once | INTRAVENOUS | Status: AC
Start: 2016-09-17 — End: 2016-09-17
  Administered 2016-09-17: 1000 mL via INTRAVENOUS

## 2016-09-17 MED ORDER — DICYCLOMINE HCL 10 MG PO CAPS
10.0000 mg | ORAL_CAPSULE | Freq: Two times a day (BID) | ORAL | Status: DC
  Administered 2016-09-17 – 2016-09-18 (×2): 10 mg via ORAL
  Filled 2016-09-17 (×2): qty 1

## 2016-09-17 MED ORDER — HYDROCODONE-ACETAMINOPHEN 10-325 MG PO TABS
1.0000 | ORAL_TABLET | Freq: Four times a day (QID) | ORAL | Status: DC
  Administered 2016-09-17 – 2016-09-18 (×4): 1 via ORAL
  Filled 2016-09-17 (×4): qty 1

## 2016-09-17 MED ORDER — ACETAMINOPHEN 650 MG RE SUPP: 650.0000 mg | Freq: Four times a day (QID) | RECTAL | Status: DC | PRN

## 2016-09-17 MED ORDER — SODIUM CHLORIDE 0.9 % IV SOLN: INTRAVENOUS | Status: DC

## 2016-09-17 MED ORDER — CLONAZEPAM 0.5 MG PO TABS
1.0000 mg | ORAL_TABLET | Freq: Three times a day (TID) | ORAL | Status: DC | PRN
  Administered 2016-09-18: 1 mg via ORAL
  Filled 2016-09-17: qty 2

## 2016-09-17 MED ORDER — FAMOTIDINE IN NACL 20-0.9 MG/50ML-% IV SOLN
20.0000 mg | Freq: Once | INTRAVENOUS | Status: AC
  Administered 2016-09-17: 20 mg via INTRAVENOUS
  Filled 2016-09-17: qty 50

## 2016-09-17 MED ORDER — NICOTINE 21 MG/24HR TD PT24
21.0000 mg | MEDICATED_PATCH | Freq: Every day | TRANSDERMAL | Status: DC
  Administered 2016-09-17 – 2016-09-18 (×2): 21 mg via TRANSDERMAL
  Filled 2016-09-17 (×2): qty 1

## 2016-09-17 MED ORDER — PANTOPRAZOLE SODIUM 40 MG PO TBEC
40.0000 mg | DELAYED_RELEASE_TABLET | Freq: Every day | ORAL | Status: DC
  Administered 2016-09-18: 40 mg via ORAL
  Filled 2016-09-17: qty 1

## 2016-09-17 MED ORDER — DULOXETINE HCL 30 MG PO CPEP
30.0000 mg | ORAL_CAPSULE | Freq: Every day | ORAL | Status: DC
  Administered 2016-09-17: 30 mg via ORAL
  Filled 2016-09-17: qty 1

## 2016-09-17 MED ORDER — INSULIN ASPART 100 UNIT/ML ~~LOC~~ SOLN
0.0000 [IU] | Freq: Three times a day (TID) | SUBCUTANEOUS | Status: DC
Start: 2016-09-18 — End: 2016-09-18
  Administered 2016-09-18 (×2): 3 [IU] via SUBCUTANEOUS

## 2016-09-17 MED ORDER — LISINOPRIL 5 MG PO TABS
2.5000 mg | ORAL_TABLET | Freq: Every day | ORAL | Status: DC
  Administered 2016-09-18: 2.5 mg via ORAL
  Filled 2016-09-17: qty 1

## 2016-09-17 MED ORDER — DEXTROSE 50 % IV SOLN: 25.0000 mL | INTRAVENOUS | Status: DC | PRN

## 2016-09-17 MED ORDER — ENOXAPARIN SODIUM 40 MG/0.4ML ~~LOC~~ SOLN
40.0000 mg | SUBCUTANEOUS | Status: DC
  Administered 2016-09-17: 40 mg via SUBCUTANEOUS
  Filled 2016-09-17: qty 0.4

## 2016-09-17 MED ORDER — DULOXETINE HCL 30 MG PO CPEP: 30.0000 mg | ORAL_CAPSULE | Freq: Every day | ORAL | Status: DC

## 2016-09-17 MED ORDER — INSULIN ASPART 100 UNIT/ML ~~LOC~~ SOLN
10.0000 [IU] | Freq: Once | SUBCUTANEOUS | Status: AC
  Administered 2016-09-17: 10 [IU] via SUBCUTANEOUS
  Filled 2016-09-17: qty 1

## 2016-09-17 MED ORDER — SODIUM CHLORIDE 0.9 % IV SOLN
INTRAVENOUS | Status: DC
  Administered 2016-09-17: 3.4 [IU]/h via INTRAVENOUS
  Filled 2016-09-17: qty 2.5

## 2016-09-17 MED ORDER — INSULIN GLARGINE 100 UNIT/ML ~~LOC~~ SOLN
20.0000 [IU] | Freq: Every day | SUBCUTANEOUS | Status: DC
  Administered 2016-09-17: 20 [IU] via SUBCUTANEOUS
  Filled 2016-09-17 (×2): qty 0.2

## 2016-09-17 MED ORDER — BOOST / RESOURCE BREEZE PO LIQD
1.0000 | Freq: Three times a day (TID) | ORAL | Status: DC
  Administered 2016-09-17: 1 via ORAL

## 2016-09-17 MED ORDER — ACETAMINOPHEN 325 MG PO TABS: 650.0000 mg | ORAL_TABLET | Freq: Four times a day (QID) | ORAL | Status: DC | PRN

## 2016-09-17 MED ORDER — METOCLOPRAMIDE HCL 5 MG/ML IJ SOLN
10.0000 mg | Freq: Once | INTRAMUSCULAR | Status: AC
  Administered 2016-09-17: 10 mg via INTRAVENOUS
  Filled 2016-09-17: qty 2

## 2016-09-17 MED ORDER — ONDANSETRON HCL 4 MG/2ML IJ SOLN
4.0000 mg | Freq: Four times a day (QID) | INTRAMUSCULAR | Status: DC | PRN
  Administered 2016-09-17 – 2016-09-18 (×2): 4 mg via INTRAVENOUS
  Filled 2016-09-17 (×2): qty 2

## 2016-09-17 MED ORDER — SODIUM CHLORIDE 0.9 % IV SOLN
INTRAVENOUS | Status: DC
  Administered 2016-09-17: 17:00:00 via INTRAVENOUS

## 2016-09-17 MED ORDER — QUETIAPINE FUMARATE 100 MG PO TABS
300.0000 mg | ORAL_TABLET | Freq: Every day | ORAL | Status: DC
  Administered 2016-09-17: 300 mg via ORAL
  Filled 2016-09-17: qty 3

## 2016-09-17 MED ORDER — DEXTROSE-NACL 5-0.45 % IV SOLN: INTRAVENOUS | Status: DC

## 2016-09-17 MED ORDER — INSULIN REGULAR BOLUS VIA INFUSION
0.0000 [IU] | Freq: Three times a day (TID) | INTRAVENOUS | Status: DC
  Filled 2016-09-17: qty 10

## 2016-09-17 MED ORDER — SODIUM CHLORIDE 0.9 % IV SOLN
INTRAVENOUS | Status: DC
Start: 2016-09-17 — End: 2016-09-18
  Administered 2016-09-17 – 2016-09-18 (×3): via INTRAVENOUS

## 2016-09-17 MED ORDER — FENTANYL CITRATE (PF) 100 MCG/2ML IJ SOLN
50.0000 ug | INTRAMUSCULAR | Status: DC | PRN
  Administered 2016-09-17: 50 ug via INTRAVENOUS
  Filled 2016-09-17: qty 2

## 2016-09-17 NOTE — ED Provider Notes (Signed)
[facility]Y DEPT Provider Note   CSN: [id number] Arrival date & time: 09/17/16  1338     History   Chief Complaint Chief Complaint  Patient presents with  . Hyperglycemia    HPI Kristin [name] is a 35 y.o. female.  HPI Pt was seen at 1500. Per pt, c/o gradual onset and persistence of constant "high blood sugars" for the past 3 days. Pt states her CBG's have been "running in the 300's and 400's" despite taking her insulin as prescribed. Has been associated with generalized abd "pain" as well as multiple intermittent episodes of N/V. Denies CP/SOB, no cough, no diarrhea, no fevers, no rash, no black or blood in stools or emesis.     Past Medical History:  Diagnosis Date  . Asthma   . Diabetes mellitus type 1 (HCC)   . GERD (gastroesophageal reflux disease)   . IBS (irritable bowel syndrome)   . Seizure [facility])     Patient Active Problem List   Diagnosis Date Noted  . DKA (diabetic ketoacidoses) (HCC) 09/16/2014  . Colitis, acute 09/16/2014  . Abdominal pain   . Hematemesis 05/05/2014  . Intractable vomiting 04/02/2014  . IBS (irritable bowel syndrome) 04/02/2014  . Tobacco abuse 04/02/2014  . Diabetes mellitus type I (HCC) 04/02/2014  . Hematochezia 08/23/2012  . Nausea & vomiting 08/23/2012    Past Surgical History:  Procedure Laterality Date  . APPENDECTOMY    . BIOPSY N/A 05/31/2014   GASTRIC BX NOT ESOPHAGEAL  . COLONOSCOPY  08/24/2012   NL COLON Bx  . COLONOSCOPY WITH PROPOFOL N/A 05/31/2014   NL COLON Bx  . ESOPHAGOGASTRODUODENOSCOPY  08/24/2012   NL DUO Bx  . ESOPHAGOGASTRODUODENOSCOPY (EGD) WITH PROPOFOL N/A 05/31/2014   GASTRITIS, NL DUO Bx  . KNEE SURGERY     left  . TUBAL LIGATION    . WRIST SURGERY     left    OB History    Gravida Para Term Preterm AB Living   2 2 2     2    SAB TAB Ectopic Multiple Live Births                   Home Medications    Prior to Admission medications   Medication Sig Start Date End Date Taking?  Authorizing Provider  clonazePAM (KLONOPIN) 1 MG tablet Take 1 mg by mouth 3 (three) times daily as needed for anxiety.   Yes Historical Provider, MD  dicyclomine (BENTYL) 10 MG capsule Take 10 mg by mouth 2 (two) times daily.   Yes Historical Provider, MD  DULoxetine (CYMBALTA) 30 MG capsule Take 30 mg by mouth daily.  08/26/16  Yes Historical Provider, MD  HYDROcodone-acetaminophen (NORCO) 10-325 MG tablet Take 1 tablet by mouth every 6 (six) hours. 08/26/16  Yes Historical Provider, MD  insulin lispro (HUMALOG) 100 UNIT/ML injection Inject 1-8 Units into the skin 3 (three) times daily before meals. Sliding Scale.   Yes Historical Provider, MD  insulin lispro protamine-insulin lispro (HUMALOG 75/25) (75-25) 100 UNIT/ML SUSP Inject 10 Units into the skin daily with supper. Per sliding scale   Yes Historical Provider, MD  lisinopril (PRINIVIL,ZESTRIL) 2.5 MG tablet Take 2.5 mg by mouth daily. 08/26/16  Yes Historical Provider, MD  omeprazole (PRILOSEC) 20 MG capsule Take 20 mg by mouth daily.   Yes Historical Provider, MD  QUEtiapine (SEROQUEL) 300 MG tablet Take 300 mg by mouth at bedtime.   Yes Historical Provider, MD  ondansetron (ZOFRAN ODT) 4 MG disintegrating  tablet 4mg  ODT q4 hours prn nausea/vomit Patient not taking: Reported on 09/17/2016 12/13/14   Bethann Berkshire, MD    Family History Family History  Problem Relation Age of Onset  . Diabetes Other   . Stroke Other   . Colon cancer Neg Hx     Social History Social History  Substance Use Topics  . Smoking status: Current Every Day Smoker    Packs/day: 1.50    Years: 12.00    Types: Cigarettes  . Smokeless tobacco: Current User  . Alcohol use No     Allergies   Sulfa antibiotics   Review of Systems Review of Systems ROS: Statement: All systems negative except as marked or noted in the HPI; Constitutional: Negative for fever and chills. +generalized weakness/fatigue. ; ; Eyes: Negative for eye pain, redness and discharge. ;  ; ENMT: Negative for ear pain, hoarseness, nasal congestion, sinus pressure and sore throat. ; ; Cardiovascular: Negative for chest pain, palpitations, diaphoresis, dyspnea and peripheral edema. ; ; Respiratory: Negative for cough, wheezing and stridor. ; ; Gastrointestinal: +N/V, abd pain. Negative for diarrhea, blood in stool, hematemesis, jaundice and rectal bleeding. . ; ; Genitourinary: Negative for dysuria, flank pain and hematuria. ; ; Musculoskeletal: Negative for back pain and neck pain. Negative for swelling and trauma.; ; Skin: Negative for pruritus, rash, abrasions, blisters, bruising and skin lesion.; ; Neuro: Negative for headache, lightheadedness and neck stiffness. Negative for altered level of consciousness, altered mental status, extremity weakness, paresthesias, involuntary movement, seizure and syncope.       Physical Exam Updated Vital Signs BP 121/70   Pulse 73   Temp 98.2 F (36.8 C) (Oral)   Resp (!) 9   Ht 5\' 2"  (1.575 m)   Wt 102 lb (46.3 kg)   LMP 09/16/2016   SpO2 100%   BMI 18.66 kg/m   Physical Exam 1505: Physical examination:  Nursing notes reviewed; Vital signs and O2 SAT reviewed;  Constitutional: Well developed, Well nourished, In no acute distress; Head:  Normocephalic, atraumatic; Eyes: EOMI, PERRL, No scleral icterus; ENMT: Mouth and pharynx normal, Mucous membranes dry; Neck: Supple, Full range of motion, No lymphadenopathy; Cardiovascular: Regular rate and rhythm, No gallop; Respiratory: Breath sounds clear & equal bilaterally, No wheezes.  Speaking full sentences with ease, Normal respiratory effort/excursion; Chest: Nontender, Movement normal; Abdomen: Soft, Nontender, Nondistended, Normal bowel sounds; Genitourinary: No CVA tenderness; Extremities: Pulses normal, No tenderness, No edema, No calf edema or asymmetry.; Neuro: AA&Ox3, Major CN grossly intact.  Speech clear. No gross focal motor or sensory deficits in extremities.; Skin: Color normal, Warm,  Dry.   ED Treatments / Results  Labs (all labs ordered are listed, but only abnormal results are displayed)   EKG  EKG Interpretation  Date/Time:  Tuesday September 17 2016 14:24:30 EST Ventricular Rate:  80 PR Interval:    QRS Duration: 87 QT Interval:  390 QTC Calculation: 450 R Axis:   73 Text Interpretation:  Sinus rhythm Borderline short PR interval When compared with ECG of 09/16/2014 No significant change was found Confirmed by [facility]  MD, Nicholos Johns [phone]) on 09/17/2016 3:11:01 PM       Radiology   Procedures Procedures (including critical care time)  Medications Ordered in ED Medications  dextrose 50 % solution 25 mL (not administered)  0.9 %  sodium chloride infusion ( Intravenous New Bag/Given 09/17/16 1650)  sodium chloride 0.9 % bolus 1,000 mL (1,000 mLs Intravenous New Bag/Given 09/17/16 1543)  sodium chloride 0.9 % bolus  1,000 mL (0 mLs Intravenous Stopped 09/17/16 1531)  metoCLOPramide (REGLAN) injection 10 mg (10 mg Intravenous Given 09/17/16 1543)  famotidine (PEPCID) IVPB 20 mg premix (20 mg Intravenous New Bag/Given 09/17/16 1544)  insulin aspart (novoLOG) injection 10 Units (10 Units Subcutaneous Given 09/17/16 1654)     Initial Impression / Assessment and Plan / ED Course  I have reviewed the triage vital signs and the nursing notes.  Pertinent labs & imaging results that were available during my care of the patient were reviewed by me and considered in my medical decision making (see chart for details).  MDM Reviewed: nursing note, previous chart and vitals Reviewed previous: labs Interpretation: labs and x-ray Total time providing critical care: 30-74 minutes. This excludes time spent performing separately reportable procedures and services. Consults: admitting MD   CRITICAL CARE Performed by: Laray AngerMCMANUS,Loye Vento M Total critical care time: 35 minutes Critical care time was exclusive of separately billable procedures and treating other  patients. Critical care was necessary to treat or prevent imminent or life-threatening deterioration. Critical care was time spent personally by me on the following activities: development of treatment plan with patient and/or surrogate as well as nursing, discussions with consultants, evaluation of patient's response to treatment, examination of patient, obtaining history from patient or surrogate, ordering and performing treatments and interventions, ordering and review of laboratory studies, ordering and review of radiographic studies, pulse oximetry and re-evaluation of patient's condition.  Results for orders placed or performed during the hospital encounter of 09/17/16  Basic metabolic panel  Result Value Ref Range   Sodium 127 (L) 135 - 145 mmol/L   Potassium 4.4 3.5 - 5.1 mmol/L   Chloride 90 (L) 101 - 111 mmol/L   CO2 19 (L) 22 - 32 mmol/L   Glucose, Bld 424 (H) 65 - 99 mg/dL   BUN 25 (H) 6 - 20 mg/dL   Creatinine, Ser 1.610.92 0.44 - 1.00 mg/dL   Calcium 8.3 (L) 8.9 - 10.3 mg/dL   GFR calc non Af Amer >60 >60 mL/min   GFR calc Af Amer >60 >60 mL/min   Anion gap 18 (H) 5 - 15  CBC  Result Value Ref Range   WBC 24.0 (H) 4.0 - 10.5 K/uL   RBC 4.39 3.87 - 5.11 MIL/uL   Hemoglobin 14.0 12.0 - 15.0 g/dL   HCT 09.641.8 04.536.0 - 40.946.0 %   MCV 95.2 78.0 - 100.0 fL   MCH 31.9 26.0 - 34.0 pg   MCHC 33.5 30.0 - 36.0 g/dL   RDW 81.113.1 91.411.5 - 78.215.5 %   Platelets 427 (H) 150 - 400 K/uL  Urinalysis, Routine w reflex microscopic  Result Value Ref Range   Color, Urine BROWN (A) YELLOW   APPearance HAZY (A) CLEAR   Specific Gravity, Urine 1.025 1.005 - 1.030   pH 5.0 5.0 - 8.0   Glucose, UA >1000 (A) NEGATIVE mg/dL   Hgb urine dipstick LARGE (A) NEGATIVE   Bilirubin Urine SMALL (A) NEGATIVE   Ketones, ur >80 (A) NEGATIVE mg/dL   Protein, ur >956>300 (A) NEGATIVE mg/dL   Nitrite POSITIVE (A) NEGATIVE   Leukocytes, UA TRACE (A) NEGATIVE  Hepatic function panel  Result Value Ref Range   Total Protein  7.1 6.5 - 8.1 g/dL   Albumin 4.2 3.5 - 5.0 g/dL   AST 17 15 - 41 U/L   ALT 12 (L) 14 - 54 U/L   Alkaline Phosphatase 62 38 - 126 U/L   Total Bilirubin 2.3 (H) 0.3 -  1.2 mg/dL   Bilirubin, Direct 0.3 0.1 - 0.5 mg/dL   Indirect Bilirubin 2.0 (H) 0.3 - 0.9 mg/dL  Lipase, blood  Result Value Ref Range   Lipase 10 (L) 11 - 51 U/L  Pregnancy, urine  Result Value Ref Range   Preg Test, Ur NEGATIVE NEGATIVE  Urine microscopic-add on  Result Value Ref Range   Squamous Epithelial / LPF 0-5 (A) NONE SEEN   WBC, UA 0-5 0 - 5 WBC/hpf   RBC / HPF TOO NUMEROUS TO COUNT 0 - 5 RBC/hpf   Bacteria, UA MANY (A) NONE SEEN  CBG monitoring, ED  Result Value Ref Range   Glucose-Capillary 367 (H) 65 - 99 mg/dL  CBG monitoring, ED  Result Value Ref Range   Glucose-Capillary 396 (H) 65 - 99 mg/dL   Dg Abd Acute W/chest Result Date: 09/17/2016 CLINICAL DATA:  Vomiting for 3 days, left side abdominal pain EXAM: DG ABDOMEN ACUTE W/ 1V CHEST COMPARISON:  04/17/2015 FINDINGS: Cardiomediastinal silhouette is unremarkable. No acute infiltrate or pleural effusion. No pulmonary edema. Moderate gas noted within transverse colon. Post tubal ligation surgical clips are noted. No evidence of free abdominal air. Normal small bowel gas pattern. IMPRESSION: No acute disease within chest. Moderate gas within transverse colon. Normal small bowel gas pattern. No free abdominal air. Electronically Signed   By: Natasha MeadLiviu  Pop M.D.   On: 09/17/2016 16:19    1640:  Mild DKA; will start IV insulin. No N/V while in the ED; abd remains benign. Dx and testing d/w pt and family.  Questions answered.  Verb understanding, agreeable to admit.  T/C to [facility] Dr. Ophelia CharterYates, case discussed, including:  HPI, pertinent PM/SHx, VS/PE, dx testing, ED course and treatment:  Agreeable to admit, requests to d/c IV insulin gtt and change to SQ insulin, write temporary orders, obtain medical bed to team [facility].    Final Clinical Impressions(s) / ED  Diagnoses   Final diagnoses:  None    New Prescriptions New Prescriptions   No medications on file     Samuel JesterKathleen Zorianna Taliaferro, DO 09/21/16 2132

## 2016-09-17 NOTE — H&P (Signed)
History and Physical    Kristin MaltaJessica M Wiedel GMW:[id number] DOB: 05/29/1981 DOA: 09/17/2016  PCP: Cassell SmilesFUSCO,LAWRENCE J., MD Consultants:  None - doesn't have insurance, fighting for SSI right now Patient coming from: home - lives with mother; Jackey LogeOK: mother, [phone]  Chief Complaint: n/v/abdominal pain  HPI: Kristin [name] is a 35 y.o. female with medical history significant of DM, IBS, asthma, GERD, seizures, and bipolar presenting with "DKA."  Patient with inability to keep down liquids, solids for 3 days.  Glucose 300+ for 3 days.  Has been checking sugar every hour and using SSI but this is not effective.  N/V persistent, diarrhea yesterday and the day before but not today (has IBS, gastroparesis).  Polyuria.  Has been using samples of insulin from Dr. Sharyon MedicusFusco's office, using Humalog 75/25 for maybe 10-12 days, thinks maybe the dose is wrong.  Had a URI a couple of months ago, otherwise no recent infections.  Intermittent cough and difficulty breathing at night and in AM but also has asthma and continues to smoke 1-2 ppd.     ED Course: Per Dr. Clarene DukeMcManus: 1640:  Mild DKA; will start IV insulin. No N/V while in the ED; abd remains benign. Dx and testing d/w pt and family.  Questions answered.  Verb understanding, agreeable to admit.  T/C to [facility] Dr. Ophelia CharterYates, case discussed, including:  HPI, pertinent PM/SHx, VS/PE, dx testing, ED course and treatment:  Agreeable to admit, requests to d/c IV insulin gtt and change to SQ insulin, write temporary orders, obtain medical bed to team [facility].  Review of Systems: As per HPI; otherwise 10 point review of systems reviewed and negative.   Ambulatory Status:  Ambulates without difficulty  Past Medical History:  Diagnosis Date  . Asthma   . Bipolar affective disorder (HCC)   . Diabetes mellitus type 1 (HCC)    diagnosed at 35yo; hospitalized multiple times for "DKA"  . GERD (gastroesophageal reflux disease)   . IBS (irritable bowel syndrome)   . Seizure  (HCC)    ?hypoglycemic seizures; last seizure was about 10/17    Past Surgical History:  Procedure Laterality Date  . APPENDECTOMY    . BIOPSY N/A 05/31/2014   GASTRIC BX NOT ESOPHAGEAL  . COLONOSCOPY  08/24/2012   NL COLON Bx  . COLONOSCOPY WITH PROPOFOL N/A 05/31/2014   NL COLON Bx  . ESOPHAGOGASTRODUODENOSCOPY  08/24/2012   NL DUO Bx  . ESOPHAGOGASTRODUODENOSCOPY (EGD) WITH PROPOFOL N/A 05/31/2014   GASTRITIS, NL DUO Bx  . KNEE SURGERY     left  . TUBAL LIGATION    . WRIST SURGERY Right     Social History   Social History  . Marital status: Legally Separated    Spouse name: N/A  . Number of children: 2  . Years of education: N/A   Occupational History  . trying to qualify for disability    Social History Main Topics  . Smoking status: Current Every Day Smoker    Packs/day: 1.50    Years: 12.00    Types: Cigarettes  . Smokeless tobacco: Never Used  . Alcohol use No  . Drug use:     Types: Marijuana     Comment: last use 3 days  . Sexual activity: Yes    Birth control/ protection: None   Other Topics Concern  . Not on file   Social History Narrative  . No narrative on file    Allergies  Allergen Reactions  . Sulfa Antibiotics Hives  Family History  Problem Relation Age of Onset  . Diabetes Other   . Stroke Other   . Colon cancer Neg Hx     Prior to Admission medications   Medication Sig Start Date End Date Taking? Authorizing Provider  clonazePAM (KLONOPIN) 1 MG tablet Take 1 mg by mouth 3 (three) times daily as needed for anxiety.   Yes Historical Provider, MD  dicyclomine (BENTYL) 10 MG capsule Take 10 mg by mouth 2 (two) times daily.   Yes Historical Provider, MD  DULoxetine (CYMBALTA) 30 MG capsule Take 30 mg by mouth daily.  08/26/16  Yes Historical Provider, MD  HYDROcodone-acetaminophen (NORCO) 10-325 MG tablet Take 1 tablet by mouth every 6 (six) hours. 08/26/16  Yes Historical Provider, MD  insulin lispro (HUMALOG) 100 UNIT/ML injection  Inject 1-8 Units into the skin 3 (three) times daily before meals. Sliding Scale.   Yes Historical Provider, MD  insulin lispro protamine-insulin lispro (HUMALOG 75/25) (75-25) 100 UNIT/ML SUSP Inject 10 Units into the skin daily with supper. Per sliding scale   Yes Historical Provider, MD  lisinopril (PRINIVIL,ZESTRIL) 2.5 MG tablet Take 2.5 mg by mouth daily. 08/26/16  Yes Historical Provider, MD  omeprazole (PRILOSEC) 20 MG capsule Take 20 mg by mouth daily.   Yes Historical Provider, MD  QUEtiapine (SEROQUEL) 300 MG tablet Take 300 mg by mouth at bedtime.   Yes Historical Provider, MD  ondansetron (ZOFRAN ODT) 4 MG disintegrating tablet 4mg  ODT q4 hours prn nausea/vomit Patient not taking: Reported on 09/17/2016 12/13/14   Bethann Berkshire, MD    Physical Exam: Vitals:   09/17/16 1730 09/17/16 1834 09/17/16 1845 09/17/16 1922  BP: 114/83  120/63   Pulse: 97  85   Resp: 23  (!) 25   Temp:  98.7 F (37.1 C) 99.6 F (37.6 C)   TempSrc:  Oral Oral   SpO2: 99%  100% 99%  Weight:   43.7 kg (96 lb 4.8 oz)   Height:   5\' 2"  (1.575 m)      General: Appears calm and comfortable and is NAD.  The room positively reeks of tobacco smoke. Eyes:  PERRL, EOMI, normal lids, iris ENT:  grossly normal hearing, lips & tongue; dry mucus membranes; poor dentition Neck:  no LAD, masses or thyromegaly Cardiovascular:  RRR, no m/r/g. No LE edema.  Respiratory:  CTA bilaterally, no w/r/r. Normal respiratory effort. Abdomen:  soft, ntnd, NABS Skin:  no rash or induration seen on limited exam Musculoskeletal:  grossly normal tone BUE/BLE, good ROM, no bony abnormality Psychiatric:  grossly normal mood and affect, speech fluent and appropriate, AOx3 Neurologic:  CN 2-12 grossly intact, moves all extremities in coordinated fashion, sensation intact  Labs on Admission: I have personally reviewed following labs and imaging studies  CBC:  Recent Labs Lab 09/17/16 1422  WBC 24.0*  HGB 14.0  HCT 41.8  MCV  95.2  PLT 427*   Basic Metabolic Panel:  Recent Labs Lab 09/17/16 1437  NA 127*  K 4.4  CL 90*  CO2 19*  GLUCOSE 424*  BUN 25*  CREATININE 0.92  CALCIUM 8.3*   GFR: Estimated Creatinine Clearance: 59.4 mL/min (by C-G formula based on SCr of 0.92 mg/dL). Liver Function Tests:  Recent Labs Lab 09/17/16 1437  AST 17  ALT 12*  ALKPHOS 62  BILITOT 2.3*  PROT 7.1  ALBUMIN 4.2    Recent Labs Lab 09/17/16 1437  LIPASE 10*   No results for input(s): AMMONIA in the last  168 hours. Coagulation Profile: No results for input(s): INR, PROTIME in the last 168 hours. Cardiac Enzymes: No results for input(s): CKTOTAL, CKMB, CKMBINDEX, TROPONINI in the last 168 hours. BNP (last 3 results) No results for input(s): PROBNP in the last 8760 hours. HbA1C: No results for input(s): HGBA1C in the last 72 hours. CBG:  Recent Labs Lab 09/17/16 1350 09/17/16 1629 09/17/16 1729 09/17/16 1807  GLUCAP 367* 396* 316* 300*   Lipid Profile: No results for input(s): CHOL, HDL, LDLCALC, TRIG, CHOLHDL, LDLDIRECT in the last 72 hours. Thyroid Function Tests: No results for input(s): TSH, T4TOTAL, FREET4, T3FREE, THYROIDAB in the last 72 hours. Anemia Panel: No results for input(s): VITAMINB12, FOLATE, FERRITIN, TIBC, IRON, RETICCTPCT in the last 72 hours. Urine analysis:    Component Value Date/Time   COLORURINE BROWN (A) 09/17/2016 1356   APPEARANCEUR HAZY (A) 09/17/2016 1356   LABSPEC 1.025 09/17/2016 1356   PHURINE 5.0 09/17/2016 1356   GLUCOSEU >1000 (A) 09/17/2016 1356   HGBUR LARGE (A) 09/17/2016 1356   BILIRUBINUR SMALL (A) 09/17/2016 1356   KETONESUR >80 (A) 09/17/2016 1356   PROTEINUR >300 (A) 09/17/2016 1356   UROBILINOGEN 0.2 09/16/2014 1950   NITRITE POSITIVE (A) 09/17/2016 1356   LEUKOCYTESUR TRACE (A) 09/17/2016 1356    Creatinine Clearance: Estimated Creatinine Clearance: 59.4 mL/min (by C-G formula based on SCr of 0.92 mg/dL).  Sepsis  Labs: @LABRCNTIP (procalcitonin:4,lacticidven:4) )No results found for this or any previous visit (from the past 240 hour(s)).   Radiological Exams on Admission: Dg Abd Acute W/chest  Result Date: 09/17/2016 CLINICAL DATA:  Vomiting for 3 days, left side abdominal pain EXAM: DG ABDOMEN ACUTE W/ 1V CHEST COMPARISON:  04/17/2015 FINDINGS: Cardiomediastinal silhouette is unremarkable. No acute infiltrate or pleural effusion. No pulmonary edema. Moderate gas noted within transverse colon. Post tubal ligation surgical clips are noted. No evidence of free abdominal air. Normal small bowel gas pattern. IMPRESSION: No acute disease within chest. Moderate gas within transverse colon. Normal small bowel gas pattern. No free abdominal air. Electronically Signed   By: Natasha MeadLiviu  Pop M.D.   On: 09/17/2016 16:19    EKG: Independently reviewed.  NSR with rate 80;  no evidence of acute ischemia  Assessment/Plan Principal Problem:   Uncontrolled diabetes mellitus (HCC) Active Problems:   Tobacco abuse   Abdominal pain   Chronic pain syndrome   Substance abuse   Bipolar disorder (HCC)   DM -Prior admissions reviewed and in conjunction with today's presentation, patient does not appear to have been in true DKA - her pH has never been in the acidotic range and her CO2 has never been lower than 19 (very borderline). -Certainly she has insulin-dependent DM, but she may be more of a type 1/type 2 hybrid than a true type 1. -I have added a C-peptide to her labs. -Regardless, she is here today with poorly controlled diabetes and needs observation, insulin, and aggressive hydration. -Despite elevated WBC count and mild hyperbilirubinemia, the patient overall appears to be without illness other than as above; would suggest f/u labs once patient is well hydrated and glucose is under better control.  N/V/abd pain and elevated bili can also result from the uncontrolled diabetes. -Her anion gap and CO2 indicate that she is  volume down, and she has pseudohyponatremia but even corrected her sodium remains 132, somewhat low. -Will continue IVF at 150cc/hr and recheck labs in AM. -Rather than start Glucostabilizer protocol, which the patient does not appear to need at this time, will give  Lantus 10 units and continue to cover with SSI. -Her etiology appears to be inadequate insulin with 75/25, which she only just started recently because samples were available to her. -If possible, would suggest basal coverage separately from bolus insulin so that the basal maintains baseline control and the bolus is used for additional insulin needs. -Anticipate improvement within 24-48 hours and discharge accordingly.  Tobacco dependence -Encourage cessation.  This was discussed with the patient and should be reviewed on an ongoing basis.   -Patch ordered at patient request.  Chronic pain -I have reviewed this patient in the [place] Controlled Substances Reporting System.  She is receiving medications from only one provider and appears to be taking them as prescribed. -However, she is also abusing marijuana. -Will continue chronic pain medications without additional IV medications at this time. -She has normal vital signs and does not appear to be in significant distress, and she has had no further n/v since arrival in the ER.  Substance abuse -Patient with UDS positive for opiates (expected) and marijuana (regular use reported by patient) today. -Reported h/o UDS positive for opiates and cocaine in 2012 at [facility] -This may be a violation of her controlled substances contract; will defer to Dr. Sherwood Gambler.  Bipolar d/o -She is on a multitude of psychotropic medications - Klonopin, Cymbalta, Seroquel. -Will continue.    DVT prophylaxis: Lovenox Code Status:  Full - confirmed with patient/family Family Communication: Mother, boyfriend present throughout evaluation  Disposition Plan:  Home once clinically improved Consults called: None   Admission status: It is my clinical opinion that referral for OBSERVATION is reasonable and necessary in this patient based on the above information provided. The aforementioned taken together are felt to place the patient at high risk for further clinical deterioration. However it is anticipated that the patient may be medically stable for discharge from the hospital within 24 to 48 hours.     Jonah Blue MD [facility]  If 7PM-7AM, please contact night-coverage [email or web address] Password [facility]  09/17/2016, 8:43 PM

## 2016-09-17 NOTE — ED Notes (Signed)
Pt stable and ready for transport to [facility].  Report called to Pearlie OysterBecky Brower, RN.

## 2016-09-17 NOTE — ED Notes (Signed)
Blood hemolyzed with iv stick. Lab in now to recollect

## 2016-09-17 NOTE — ED Triage Notes (Signed)
PT c/o abdominal pain with n/v with headache and generalized weakness. PT c/o CBG no lower than 300 over the past few days as well. Last Insulin (Humalog 3units) at 1030 today. CBG in triage was 367.

## 2016-09-18 DIAGNOSIS — E0800 Diabetes mellitus due to underlying condition with hyperosmolarity without nonketotic hyperglycemic-hyperosmolar coma (NKHHC): Secondary | ICD-10-CM

## 2016-09-18 DIAGNOSIS — Z720 Tobacco use: Secondary | ICD-10-CM

## 2016-09-18 DIAGNOSIS — Z794 Long term (current) use of insulin: Secondary | ICD-10-CM

## 2016-09-18 DIAGNOSIS — N390 Urinary tract infection, site not specified: Secondary | ICD-10-CM

## 2016-09-18 DIAGNOSIS — E109 Type 1 diabetes mellitus without complications: Secondary | ICD-10-CM

## 2016-09-18 LAB — CBC
HCT: 33.9 % — ABNORMAL LOW (ref 36.0–46.0)
HEMOGLOBIN: 11.1 g/dL — AB (ref 12.0–15.0)
MCH: 31.3 pg (ref 26.0–34.0)
MCHC: 32.7 g/dL (ref 30.0–36.0)
MCV: 95.5 fL (ref 78.0–100.0)
Platelets: 327 10*3/uL (ref 150–400)
RBC: 3.55 MIL/uL — AB (ref 3.87–5.11)
RDW: 12.8 % (ref 11.5–15.5)
WBC: 11 10*3/uL — AB (ref 4.0–10.5)

## 2016-09-18 LAB — BASIC METABOLIC PANEL
ANION GAP: 6 (ref 5–15)
BUN: 15 mg/dL (ref 6–20)
CALCIUM: 7.8 mg/dL — AB (ref 8.9–10.3)
CO2: 24 mmol/L (ref 22–32)
Chloride: 106 mmol/L (ref 101–111)
Creatinine, Ser: 0.56 mg/dL (ref 0.44–1.00)
GLUCOSE: 204 mg/dL — AB (ref 65–99)
Potassium: 3.5 mmol/L (ref 3.5–5.1)
SODIUM: 136 mmol/L (ref 135–145)

## 2016-09-18 LAB — HEPATIC FUNCTION PANEL
ALT: 7 U/L — ABNORMAL LOW (ref 14–54)
AST: 10 U/L — AB (ref 15–41)
Albumin: 3.3 g/dL — ABNORMAL LOW (ref 3.5–5.0)
Alkaline Phosphatase: 48 U/L (ref 38–126)
BILIRUBIN DIRECT: 0.2 mg/dL (ref 0.1–0.5)
BILIRUBIN INDIRECT: 1.6 mg/dL — AB (ref 0.3–0.9)
Total Bilirubin: 1.8 mg/dL — ABNORMAL HIGH (ref 0.3–1.2)
Total Protein: 6 g/dL — ABNORMAL LOW (ref 6.5–8.1)

## 2016-09-18 LAB — GLUCOSE, CAPILLARY
Glucose-Capillary: 179 mg/dL — ABNORMAL HIGH (ref 65–99)
Glucose-Capillary: 195 mg/dL — ABNORMAL HIGH (ref 65–99)

## 2016-09-18 LAB — URINE CULTURE: CULTURE: NO GROWTH

## 2016-09-18 LAB — HEMOGLOBIN A1C
HEMOGLOBIN A1C: 8.9 % — AB (ref 4.8–5.6)
MEAN PLASMA GLUCOSE: 209 mg/dL

## 2016-09-18 MED ORDER — AMOXICILLIN 500 MG PO TABS: 500.0000 mg | ORAL_TABLET | Freq: Two times a day (BID) | ORAL | 0 refills | Status: DC

## 2016-09-18 MED ORDER — BOOST / RESOURCE BREEZE PO LIQD: 1.0000 | Freq: Three times a day (TID) | ORAL | 0 refills | Status: AC

## 2016-09-18 MED ORDER — INSULIN LISPRO PROT & LISPRO (75-25 MIX) 100 UNIT/ML ~~LOC~~ SUSP: 12.0000 [IU] | Freq: Every day | SUBCUTANEOUS | 11 refills | Status: DC

## 2016-09-18 MED ORDER — ONDANSETRON HCL 4 MG PO TABS: 4.0000 mg | ORAL_TABLET | Freq: Three times a day (TID) | ORAL | 0 refills | Status: DC | PRN

## 2016-09-18 NOTE — Clinical Social Work Note (Signed)
CSW received consult for medication assistance. CM notified. Will sign off.  Derenda FennelKara Tahji , LCSW [phone]

## 2016-09-18 NOTE — Progress Notes (Signed)
Inpatient Diabetes Program Recommendations  AACE/ADA: New Consensus Statement on Inpatient Glycemic Control (2015)  Target Ranges:  Prepandial:   less than 140 mg/dL      Peak postprandial:   less than 180 mg/dL (1-2 hours)      Critically ill patients:  140 - 180 mg/dL  Results for Kristin [name], Elbony M (MRN [id number]) as of 09/18/2016 07:34  Ref. Range 09/17/2016 13:50 09/17/2016 16:29 09/17/2016 17:29 09/17/2016 18:07 09/17/2016 21:25 09/18/2016 07:19  Glucose-Capillary Latest Ref Range: 65 - 99 mg/dL 253367 (H) 664396 (H) 403316 (H) 300 (H) 170 (H) 179 (H)    Review of Glycemic Control  Diabetes history: DM1 Outpatient Diabetes medications: Humalog 1-8 units TID with meals, Humalog 75/25 10 units with supper Current orders for Inpatient glycemic control: Lantus 20 units QHS, Novolog 0-15 units TID with meals  Inpatient Diabetes Program Recommendations: Correction (SSI): Please consider decreasing Novolog correction to sensitive scale since patient has Type 1 diabetes. Also, please consider ordering Novolog 0-5 units QHS for bedtime correction. Insulin - Meal Coverage: Please consider ordering Novolog 3 units TID with meals for meal coverage if patient eats at least 50% of meals. HgbA1C: A1C 8.9% on 09/17/16 indicating an average glucose of 209 mg/dl over the past 2-3 months.  Thanks, Orlando PennerMarie Masiah Lewing, RN, MSN, CDE Diabetes Coordinator Inpatient Diabetes Program [phone] (Team Pager from 8am to 5pm)

## 2016-09-18 NOTE — Progress Notes (Signed)
[name] discharged Home per MD order.  Discharge instructions reviewed and discussed with the patient, all questions and concerns answered. Copy of instructions and scripts given to patient.    Medication List    TAKE these medications   amoxicillin 500 MG tablet Commonly known as:  AMOXIL Take 1 tablet (500 mg total) by mouth 2 (two) times daily.   clonazePAM 1 MG tablet Commonly known as:  KLONOPIN Take 1 mg by mouth 3 (three) times daily as needed for anxiety.   dicyclomine 10 MG capsule Commonly known as:  BENTYL Take 10 mg by mouth 2 (two) times daily.   DULoxetine 30 MG capsule Commonly known as:  CYMBALTA Take 30 mg by mouth daily.   feeding supplement Liqd Take 1 Container by mouth 3 (three) times daily between meals.   HYDROcodone-acetaminophen 10-325 MG tablet Commonly known as:  NORCO Take 1 tablet by mouth every 6 (six) hours.   insulin lispro 100 UNIT/ML injection Commonly known as:  HUMALOG Inject 1-8 Units into the skin 3 (three) times daily before meals. Sliding Scale.   insulin lispro protamine-lispro (75-25) 100 UNIT/ML Susp injection Commonly known as:  HUMALOG 75/25 MIX Inject 12 Units into the skin daily with supper. Per sliding scale What changed:  how much to take   lisinopril 2.5 MG tablet Commonly known as:  PRINIVIL,ZESTRIL Take 2.5 mg by mouth daily.   omeprazole 20 MG capsule Commonly known as:  PRILOSEC Take 20 mg by mouth daily.   ondansetron 4 MG tablet Commonly known as:  ZOFRAN Take 1 tablet (4 mg total) by mouth every 8 (eight) hours as needed for nausea or vomiting.   QUEtiapine 300 MG tablet Commonly known as:  SEROQUEL Take 300 mg by mouth at bedtime.       Patients skin is clean, dry and intact, no evidence of skin break down. IV site discontinued and catheter remains intact. Site without signs and symptoms of complications. Dressing and pressure applied.  Patient escorted to car by NT in a wheelchair,  no  distress noted upon discharge.  Kristin KoyanagiBonnie M Maverick Patman 09/18/2016 2:28 PM

## 2016-09-18 NOTE — Discharge Instructions (Signed)

## 2016-09-18 NOTE — Care Management Note (Signed)
Case Management Note  Patient Details  Name: Kristin MaltaJessica M Renderos MRN: [id number] Date of Birth: 08/03/1981  Subjective/Objective:                  Pt admitted with uncontrolled DM. She is from home, lives with her parents. Boyfriend at bedside. Pt is uninsured and unemployed. FC is aware of uninsured status. Pt has PCP, Dr. Sherwood GamblerFusco, where her parents pay OOP. He manages her DM. Pt states she has insulin at home. Pt provided resources for med assistance including [facility] and [facility] providers who will see her with no insurance on sliding scale fee. Pt Rx abx and nausea meds. Pt provided with MATCH voucher. Pt understands if she uses the voucher she will not be eligible for another one for one year.   Action/Plan: No further needs. Pt discharging home today.   Expected Discharge Date:      09/18/2016            Expected Discharge Plan:  Home/Self Care  In-House Referral:  Financial Counselor  Discharge planning Services  [facility]  Post Acute Care Choice:  NA Choice offered to:  NA  Status of Service:  Completed, signed off    Malcolm MetroChildress, Aliya Demske, RN 09/18/2016, 12:55 PM

## 2016-09-18 NOTE — Discharge Summary (Signed)
Physician Discharge Summary  Kristin [name] Reust ZOX:[id number] DOB: 1981/09/09 DOA: 09/17/2016  PCP: Cassell Smiles., MD  Admit date: 09/17/2016 Discharge date: 09/18/2016  Admit date: 09/17/2016 Discharge date: 09/18/2016  Admitted From: Home Disposition:  Home  Recommendations for Outpatient Follow-up:  1. Follow up with PCP in 1 week. 2. Please follow urine culture results. Patient is being discharged on oral amoxicillin for 3 days (stop date 11/11)  Home Health: None Equipment/Devices: None  Discharge Condition: Fair CODE STATUS: Full code Diet recommendation: carb modified    Discharge Diagnoses:  Principal Problem:   Uncontrolled diabetes mellitus with hyper and hypoglycemia (HCC)   Active Problems:   Tobacco abuse   Abdominal pain   DKA (diabetic ketoacidosis) (HCC)   Chronic pain syndrome   Substance abuse   Bipolar disorder (HCC)   Brittle diabetes mellitus (HCC)   UTI (urinary tract infection)  Brief narrative/history of present illness Please refer to admission H&P for details, in brief, 35 y.o. female with medical history significant of type 1 DM, IBS, asthma, GERD, seizures, tobacco and marijuana use and bipolar disorder presentedto the ED with inability to keep down liquids, solids for 3 days. Blood glucose elevated >300 for 3 days and sometimes unrecordable..   and reports polyuria and some diarrhea. At baseline has extremely fluctuating blood glucose ranging from 20s-400s single day.  Has been using samples of insulin from Dr. Sharyon Medicus office, using Humalog 75/25 in units at bedtime and sliding scale insulin.  reports using insulin as prescribed. Eyes any fevers, chills, headache, URI symptoms, chest pain, shortness of breath, abdominal pain, dysuria.  In the ED patient was found to have WBC of 20 4K, sodium of 127, (normal when corrected), and an gap of 18 and blood glucose in 400s. Started on insulin drip then switched to Lantus with sliding-scale coverage.  Abdominal x-ray was unremarkable. Observed under hospitalist service.  Hospital course Brittle type 1 diabetes mellitus with mild DKA  DKA resolved shortly after initiating insulin drip and volume resuscitation in the ED.. CBG has been stable since admission on Lantus with sliding scale coverage. Patient does not have insurance and get free samples of insulin from her PCP. I spoke with Dr. Sherwood Gambler at length morning. He agrees that patient has brittle diabetes and is waiting for her to get some insurance so that he can start her on an insulin pump and refer her to endocrinology. Recommended to increase her nighttime insulin to 12 units daily and have her continue sliding scale coverage. She should follow-up with him within a week time.    UTI Asymptomatic. Urine culture sent on admission and should be followed as outpatient. I will treat her with amoxicillin for 3 days duration.  Chronic pain Resume home medications. No prescriptions provided.  Nausea and vomiting Secondary to DKA. Improving. Diet advance to full liquid. Will prescribe some Zofran.   echo and marijuana use. Smokes 1-2 packs of cigarettes per day. Counseled strongly on cessation. Offered nicotine patch but declined stating she cannot afford it.  Bipolar disorder On multiple medications including Cymbalta, Klonopin and Seroquel. Resumed.   Consults: None  Family communication: Fianc at bedside  Disposition: Home     Discharge Instructions     Medication List    TAKE these medications   amoxicillin 500 MG tablet Commonly known as:  AMOXIL Take 1 tablet (500 mg total) by mouth 2 (two) times daily.   clonazePAM 1 MG tablet Commonly known as:  KLONOPIN Take 1 mg by mouth  3 (three) times daily as needed for anxiety.   dicyclomine 10 MG capsule Commonly known as:  BENTYL Take 10 mg by mouth 2 (two) times daily.   DULoxetine 30 MG capsule Commonly known as:  CYMBALTA Take 30 mg by mouth daily.    feeding supplement Liqd Take 1 Container by mouth 3 (three) times daily between meals.   HYDROcodone-acetaminophen 10-325 MG tablet Commonly known as:  NORCO Take 1 tablet by mouth every 6 (six) hours.   insulin lispro 100 UNIT/ML injection Commonly known as:  HUMALOG Inject 1-8 Units into the skin 3 (three) times daily before meals. Sliding Scale.   insulin lispro protamine-lispro (75-25) 100 UNIT/ML Susp injection Commonly known as:  HUMALOG 75/25 MIX Inject 12 Units into the skin daily with supper. Per sliding scale What changed:  how much to take   lisinopril 2.5 MG tablet Commonly known as:  PRINIVIL,ZESTRIL Take 2.5 mg by mouth daily.   omeprazole 20 MG capsule Commonly known as:  PRILOSEC Take 20 mg by mouth daily.   ondansetron 4 MG tablet Commonly known as:  ZOFRAN Take 1 tablet (4 mg total) by mouth every 8 (eight) hours as needed for nausea or vomiting.   QUEtiapine 300 MG tablet Commonly known as:  SEROQUEL Take 300 mg by mouth at bedtime.      Follow-up Information    Cassell SmilesFUSCO,LAWRENCE J., MD Follow up.   Specialty:  Internal Medicine Contact information: [address] [place] [address] [phone]          Allergies  Allergen Reactions  . Sulfa Antibiotics Hives        Procedures/Studies: Dg Abd Acute W/chest  Result Date: 09/17/2016 CLINICAL DATA:  Vomiting for 3 days, left side abdominal pain EXAM: DG ABDOMEN ACUTE W/ 1V CHEST COMPARISON:  04/17/2015 FINDINGS: Cardiomediastinal silhouette is unremarkable. No acute infiltrate or pleural effusion. No pulmonary edema. Moderate gas noted within transverse colon. Post tubal ligation surgical clips are noted. No evidence of free abdominal air. Normal small bowel gas pattern. IMPRESSION: No acute disease within chest. Moderate gas within transverse colon. Normal small bowel gas pattern. No free abdominal air. Electronically Signed   By: Natasha MeadLiviu  Pop M.D.   On: 09/17/2016 16:19        Subjective: Reports some nausea but no vomiting. CBG stable.  Discharge Exam: Vitals:   09/17/16 2144 09/18/16 0500  BP: (!) 110/59 124/70  Pulse: 71 82  Resp: 20 (!) 30  Temp: 98.9 F (37.2 C) 98.6 F (37 C)   Vitals:   09/17/16 1845 09/17/16 1922 09/17/16 2144 09/18/16 0500  BP: 120/63  (!) 110/59 124/70  Pulse: 85  71 82  Resp: (!) 25  20 (!) 30  Temp: 99.6 F (37.6 C)  98.9 F (37.2 C) 98.6 F (37 C)  TempSrc: Oral  Oral Oral  SpO2: 100% 99% 100% 100%  Weight: 43.7 kg (96 lb 4.8 oz)     Height: 5\' 2"  (1.575 m)       General: Thin Built female not in distress HEENT:  moist mucosa, supple neck Cardiovascular: RRR, S1/S2 +, no murmurs Chest: CTA bilaterally,  GI: Soft, NT, ND, bowel sounds + Musculoskeletal: Warm, no edema    The results of significant diagnostics from this hospitalization (including imaging, microbiology, ancillary and laboratory) are listed below for reference.     Microbiology: No results found for this or any previous visit (from the past 240 hour(s)).   Labs: BNP (last 3 results) No results  for input(s): BNP in the last 8760 hours. Basic Metabolic Panel:  Recent Labs Lab 09/17/16 1437 09/18/16 0618  NA 127* 136  K 4.4 3.5  CL 90* 106  CO2 19* 24  GLUCOSE 424* 204*  BUN 25* 15  CREATININE 0.92 0.56  CALCIUM 8.3* 7.8*   Liver Function Tests:  Recent Labs Lab 09/17/16 1437 09/18/16 0618  AST 17 10*  ALT 12* 7*  ALKPHOS 62 48  BILITOT 2.3* 1.8*  PROT 7.1 6.0*  ALBUMIN 4.2 3.3*    Recent Labs Lab 09/17/16 1437  LIPASE 10*   No results for input(s): AMMONIA in the last 168 hours. CBC:  Recent Labs Lab 09/17/16 1422 09/18/16 0618  WBC 24.0* 11.0*  HGB 14.0 11.1*  HCT 41.8 33.9*  MCV 95.2 95.5  PLT 427* 327   Cardiac Enzymes: No results for input(s): CKTOTAL, CKMB, CKMBINDEX, TROPONINI in the last 168 hours. BNP: Invalid input(s): POCBNP CBG:  Recent Labs Lab 09/17/16 1729 09/17/16 1807  09/17/16 2125 09/18/16 0719 09/18/16 1057  GLUCAP 316* 300* 170* 179* 195*   D-Dimer No results for input(s): DDIMER in the last 72 hours. Hgb A1c  Recent Labs  09/17/16 1437  HGBA1C 8.9*   Lipid Profile No results for input(s): CHOL, HDL, LDLCALC, TRIG, CHOLHDL, LDLDIRECT in the last 72 hours. Thyroid function studies No results for input(s): TSH, T4TOTAL, T3FREE, THYROIDAB in the last 72 hours.  Invalid input(s): FREET3 Anemia work up No results for input(s): VITAMINB12, FOLATE, FERRITIN, TIBC, IRON, RETICCTPCT in the last 72 hours. Urinalysis    Component Value Date/Time   COLORURINE BROWN (A) 09/17/2016 1356   APPEARANCEUR HAZY (A) 09/17/2016 1356   LABSPEC 1.025 09/17/2016 1356   PHURINE 5.0 09/17/2016 1356   GLUCOSEU >1000 (A) 09/17/2016 1356   HGBUR LARGE (A) 09/17/2016 1356   BILIRUBINUR SMALL (A) 09/17/2016 1356   KETONESUR >80 (A) 09/17/2016 1356   PROTEINUR >300 (A) 09/17/2016 1356   UROBILINOGEN 0.2 09/16/2014 1950   NITRITE POSITIVE (A) 09/17/2016 1356   LEUKOCYTESUR TRACE (A) 09/17/2016 1356   Sepsis Labs Invalid input(s): PROCALCITONIN,  WBC,  LACTICIDVEN Microbiology No results found for this or any previous visit (from the past 240 hour(s)).   Time coordinating discharge: <30 minutes  SIGNED:   Eddie NorthHUNGEL, Rhia Blatchford, MD  [facility] 09/18/2016, 11:54 AM Pager   If 7PM-7AM, please contact night-coverage [email or web address] Password [email or web address]

## 2016-09-19 LAB — C-PEPTIDE

## 2016-12-13 ENCOUNTER — Other Ambulatory Visit (HOSPITAL_COMMUNITY): Payer: Self-pay | Admitting: Diagnostic Radiology

## 2016-12-13 ENCOUNTER — Ambulatory Visit (HOSPITAL_COMMUNITY)
Admission: RE | Admit: 2016-12-13 | Discharge: 2016-12-13 | Disposition: A | Discharge status: Home / Self Care | Payer: Self-pay | Source: Ambulatory Visit | Attending: Internal Medicine | Admitting: Internal Medicine

## 2016-12-13 ENCOUNTER — Other Ambulatory Visit (HOSPITAL_COMMUNITY): Payer: Self-pay | Admitting: Internal Medicine

## 2016-12-13 DIAGNOSIS — M549 Dorsalgia, unspecified: Secondary | ICD-10-CM

## 2016-12-13 DIAGNOSIS — Z8781 Personal history of (healed) traumatic fracture: Secondary | ICD-10-CM | POA: Insufficient documentation

## 2016-12-13 DIAGNOSIS — G8929 Other chronic pain: Secondary | ICD-10-CM

## 2016-12-13 DIAGNOSIS — M25562 Pain in left knee: Secondary | ICD-10-CM | POA: Insufficient documentation

## 2017-01-14 ENCOUNTER — Emergency Department (HOSPITAL_COMMUNITY): Payer: Self-pay

## 2017-01-14 ENCOUNTER — Encounter (HOSPITAL_COMMUNITY): Payer: Self-pay | Admitting: *Deleted

## 2017-01-14 ENCOUNTER — Emergency Department (HOSPITAL_COMMUNITY)
Admission: EM | Admit: 2017-01-14 | Discharge: 2017-01-15 | Disposition: A | Discharge status: Home / Self Care | Payer: Self-pay | Attending: Emergency Medicine | Admitting: Emergency Medicine

## 2017-01-14 DIAGNOSIS — E109 Type 1 diabetes mellitus without complications: Secondary | ICD-10-CM | POA: Insufficient documentation

## 2017-01-14 DIAGNOSIS — J45909 Unspecified asthma, uncomplicated: Secondary | ICD-10-CM | POA: Insufficient documentation

## 2017-01-14 DIAGNOSIS — R1084 Generalized abdominal pain: Secondary | ICD-10-CM | POA: Insufficient documentation

## 2017-01-14 DIAGNOSIS — R112 Nausea with vomiting, unspecified: Secondary | ICD-10-CM | POA: Insufficient documentation

## 2017-01-14 DIAGNOSIS — G8929 Other chronic pain: Secondary | ICD-10-CM | POA: Insufficient documentation

## 2017-01-14 DIAGNOSIS — F1290 Cannabis use, unspecified, uncomplicated: Secondary | ICD-10-CM | POA: Insufficient documentation

## 2017-01-14 DIAGNOSIS — F17210 Nicotine dependence, cigarettes, uncomplicated: Secondary | ICD-10-CM | POA: Insufficient documentation

## 2017-01-14 DIAGNOSIS — R109 Unspecified abdominal pain: Secondary | ICD-10-CM

## 2017-01-14 DIAGNOSIS — Z79899 Other long term (current) drug therapy: Secondary | ICD-10-CM | POA: Insufficient documentation

## 2017-01-14 HISTORY — DX: Other chronic pain: G89.29

## 2017-01-14 HISTORY — DX: Unspecified abdominal pain: R10.9

## 2017-01-14 HISTORY — DX: Other psychoactive substance abuse, uncomplicated: F19.10

## 2017-01-14 HISTORY — DX: Nausea with vomiting, unspecified: R11.2

## 2017-01-14 LAB — COMPREHENSIVE METABOLIC PANEL
ALK PHOS: 47 U/L (ref 38–126)
ALT: 9 U/L — AB (ref 14–54)
AST: 12 U/L — AB (ref 15–41)
Albumin: 4.6 g/dL (ref 3.5–5.0)
Anion gap: 13 (ref 5–15)
BILIRUBIN TOTAL: 1.5 mg/dL — AB (ref 0.3–1.2)
BUN: 31 mg/dL — AB (ref 6–20)
CHLORIDE: 98 mmol/L — AB (ref 101–111)
CO2: 23 mmol/L (ref 22–32)
CREATININE: 0.62 mg/dL (ref 0.44–1.00)
Calcium: 9.2 mg/dL (ref 8.9–10.3)
GFR calc Af Amer: >60 mL/min (ref >60)
Glucose, Bld: 70 mg/dL (ref 65–99)
Potassium: 3.9 mmol/L (ref 3.5–5.1)
Sodium: 134 mmol/L — ABNORMAL LOW (ref 135–145)
TOTAL PROTEIN: 7.8 g/dL (ref 6.5–8.1)

## 2017-01-14 LAB — CBC
HCT: 38.5 % (ref 36.0–46.0)
Hemoglobin: 13.4 g/dL (ref 12.0–15.0)
MCH: 30.5 pg (ref 26.0–34.0)
MCHC: 34.8 g/dL (ref 30.0–36.0)
MCV: 87.5 fL (ref 78.0–100.0)
PLATELETS: 321 10*3/uL (ref 150–400)
RBC: 4.4 MIL/uL (ref 3.87–5.11)
RDW: 13.6 % (ref 11.5–15.5)
WBC: 11.2 10*3/uL — AB (ref 4.0–10.5)

## 2017-01-14 LAB — URINALYSIS, ROUTINE W REFLEX MICROSCOPIC
Bilirubin Urine: NEGATIVE
GLUCOSE, UA: NEGATIVE mg/dL
KETONES UR: 80 mg/dL — AB
Nitrite: NEGATIVE
PH: 5 (ref 5.0–8.0)
Protein, ur: 100 mg/dL — AB
Specific Gravity, Urine: 1.026 (ref 1.005–1.030)

## 2017-01-14 LAB — PREGNANCY, URINE: Preg Test, Ur: NEGATIVE

## 2017-01-14 LAB — LIPASE, BLOOD: Lipase: <10 U/L — ABNORMAL LOW (ref 11–51)

## 2017-01-14 MED ORDER — FAMOTIDINE IN NACL 20-0.9 MG/50ML-% IV SOLN
20.0000 mg | Freq: Once | INTRAVENOUS | Status: AC
  Administered 2017-01-14: 20 mg via INTRAVENOUS
  Filled 2017-01-14: qty 50

## 2017-01-14 MED ORDER — METOCLOPRAMIDE HCL 5 MG/ML IJ SOLN
10.0000 mg | Freq: Once | INTRAMUSCULAR | Status: AC
  Administered 2017-01-14: 10 mg via INTRAVENOUS
  Filled 2017-01-14: qty 2

## 2017-01-14 MED ORDER — LORAZEPAM 2 MG/ML IJ SOLN
1.0000 mg | Freq: Once | INTRAMUSCULAR | Status: AC
  Administered 2017-01-14: 1 mg via INTRAVENOUS
  Filled 2017-01-14: qty 1

## 2017-01-14 MED ORDER — SODIUM CHLORIDE 0.9 % IV BOLUS (SEPSIS)
1000.0000 mL | Freq: Once | INTRAVENOUS | Status: AC
  Administered 2017-01-14: 1000 mL via INTRAVENOUS

## 2017-01-14 MED ORDER — ONDANSETRON 4 MG PO TBDP
4.0000 mg | ORAL_TABLET | Freq: Once | ORAL | Status: AC
  Administered 2017-01-14: 4 mg via ORAL
  Filled 2017-01-14: qty 1

## 2017-01-14 MED ORDER — DIPHENHYDRAMINE HCL 50 MG/ML IJ SOLN
50.0000 mg | Freq: Once | INTRAMUSCULAR | Status: AC
  Administered 2017-01-14: 50 mg via INTRAVENOUS
  Filled 2017-01-14: qty 1

## 2017-01-14 NOTE — ED Triage Notes (Signed)
Pt comes in for abdominal pain and vomiting starting 3 days ago. Pt states she has a hx of gastroparesis, states this feels the same. Verbalizes her CBG has been running high 100's

## 2017-01-14 NOTE — ED Notes (Signed)
Pt c/o abd pain that started 3 days ago, back pain and headache, pt states that her abd pain is the same as she has had in the past with her gastroparesis, Dr Clarene DukeMcmanus at bedside in prior to RN, see edp assessment for further,

## 2017-01-14 NOTE — ED Notes (Signed)
Pt returned from xray,  

## 2017-01-14 NOTE — ED Notes (Signed)
Pt states that her headache is gone but still continues to have abd and back pain,

## 2017-01-14 NOTE — ED Notes (Signed)
Pt and family updated on plan of care,  

## 2017-01-15 MED ORDER — ONDANSETRON 4 MG PO TBDP
4.0000 mg | ORAL_TABLET | Freq: Three times a day (TID) | ORAL | 0 refills | Status: DC | PRN
Start: 2017-01-15 — End: 2022-03-14

## 2017-01-15 MED ORDER — METOCLOPRAMIDE HCL 10 MG PO TABS: 10.0000 mg | ORAL_TABLET | Freq: Four times a day (QID) | ORAL | 0 refills | Status: DC | PRN

## 2017-01-15 MED ORDER — CEPHALEXIN 500 MG PO CAPS: 500.0000 mg | ORAL_CAPSULE | Freq: Four times a day (QID) | ORAL | 0 refills | Status: DC

## 2017-01-15 MED ORDER — CEFTRIAXONE SODIUM 1 G IJ SOLR
1.0000 g | Freq: Once | INTRAMUSCULAR | Status: AC
  Administered 2017-01-15: 1 g via INTRAVENOUS
  Filled 2017-01-15: qty 10

## 2017-01-15 NOTE — ED Provider Notes (Signed)
[facility]-EMERGENCY DEPT Provider Note   CSN: [id number] Arrival date & time: 01/14/17  1811     History   Chief Complaint Chief Complaint  Patient presents with  . Abdominal Pain    HPI Kristin [name] is a 36 y.o. female.  HPI  Pt was seen at 2150. Per pt, c/o gradual onset and persistence of multiple intermittent episodes of N/V that began 3 days ago. Has been associated with acute flair of her chronic generalized abd "pain." Pt states "I think it's my gastroparesis." Denies CP/SOB, no back pain, no fevers, no black or blood in stools or emesis. The symptoms have been associated with no other complaints. The patient has a significant history of similar symptoms previously, recently being evaluated for this complaint and multiple prior evals for same.      Past Medical History:  Diagnosis Date  . Asthma   . Bipolar affective disorder (HCC)   . Chronic abdominal pain   . Chronic pain   . Diabetes mellitus type 1 (HCC)    diagnosed at 36yo; hospitalized multiple times for "DKA"  . GERD (gastroesophageal reflux disease)   . IBS (irritable bowel syndrome)   . Nausea and vomiting   . 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 Substance abuse     Patient Active Problem List   Diagnosis Date Noted  . Brittle diabetes mellitus (HCC) 09/18/2016  . UTI (urinary tract infection) 09/18/2016  . DKA (diabetic ketoacidosis) (HCC) 09/17/2016  . Uncontrolled diabetes mellitus (HCC) 09/17/2016  . Chronic pain syndrome 09/17/2016  . Substance abuse 09/17/2016  . Bipolar disorder (HCC) 09/17/2016  . Colitis, acute 09/16/2014  . Abdominal pain   . Hematemesis 05/05/2014  . Intractable vomiting 04/02/2014  . IBS (irritable bowel syndrome) 04/02/2014  . Tobacco abuse 04/02/2014  . Diabetes mellitus type I (HCC) 04/02/2014  . Hematochezia 08/23/2012  . Nausea & vomiting 08/23/2012    Past Surgical History:  Procedure Laterality Date  . APPENDECTOMY      . BIOPSY N/A 05/31/2014   GASTRIC BX NOT ESOPHAGEAL  . COLONOSCOPY  08/24/2012   NL COLON Bx  . COLONOSCOPY WITH PROPOFOL N/A 05/31/2014   NL COLON Bx  . ESOPHAGOGASTRODUODENOSCOPY  08/24/2012   NL DUO Bx  . ESOPHAGOGASTRODUODENOSCOPY (EGD) WITH PROPOFOL N/A 05/31/2014   GASTRITIS, NL DUO Bx  . KNEE SURGERY     left  . TUBAL LIGATION    . WRIST SURGERY Right     OB History    Gravida Para Term Preterm AB Living   2 2 2     2    SAB TAB Ectopic Multiple Live Births                   Home Medications    Prior to Admission medications   Medication Sig Start Date End Date Taking? Authorizing Provider  busPIRone (BUSPAR) 10 MG tablet Take 10 mg by mouth 2 (two) times daily.   Yes Historical Provider, MD  clonazePAM (KLONOPIN) 1 MG tablet Take 1 mg by mouth 3 (three) times daily as needed for anxiety.   Yes Historical Provider, MD  DULoxetine (CYMBALTA) 60 MG capsule Take 60 mg by mouth daily.  08/26/16  Yes Historical Provider, MD  feeding supplement (BOOST / RESOURCE BREEZE) LIQD Take 1 Container by mouth 3 (three) times daily between meals. 09/18/16  Yes Nishant Dhungel, MD  Insulin Glargine (BASAGLAR KWIKPEN) 100 UNIT/ML SOPN Inject 8-12 Units into  the skin 2 (two) times daily. 12u in the morning and 8u at bedtime   Yes Historical Provider, MD  insulin lispro (HUMALOG KWIKPEN) 100 UNIT/ML KiwkPen Inject 1-30 Units into the skin 3 (three) times daily. Based on sliding scale, 1u for every 10g of carbs   Yes Historical Provider, MD  lisinopril (PRINIVIL,ZESTRIL) 2.5 MG tablet Take 2.5 mg by mouth daily. 08/26/16  Yes Historical Provider, MD  oxyCODONE-acetaminophen (PERCOCET) 10-325 MG tablet Take 1 tablet by mouth every 6 (six) hours as needed for pain.   Yes Historical Provider, MD  pantoprazole (PROTONIX) 40 MG tablet Take 40 mg by mouth daily.   Yes Historical Provider, MD  QUEtiapine (SEROQUEL) 300 MG tablet Take 300 mg by mouth at bedtime.   Yes Historical Provider, MD     Family History Family History  Problem Relation Age of Onset  . Diabetes Other   . Stroke Other   . Colon cancer Neg Hx     Social History Social History  Substance Use Topics  . Smoking status: Current Every Day Smoker    Packs/day: 1.50    Years: 12.00    Types: Cigarettes  . Smokeless tobacco: Never Used  . Alcohol use No     Allergies   Sulfa antibiotics   Review of Systems Review of Systems ROS: Statement: All systems negative except as marked or noted in the HPI; Constitutional: Negative for fever and chills. ; ; Eyes: Negative for eye pain, redness and discharge. ; ; ENMT: Negative for ear pain, hoarseness, nasal congestion, sinus pressure and sore throat. ; ; Cardiovascular: Negative for chest pain, palpitations, diaphoresis, dyspnea and peripheral edema. ; ; Respiratory: Negative for cough, wheezing and stridor. ; ; Gastrointestinal: +recurrent N/V, chronic abd pain. Negative for diarrhea, blood in stool, hematemesis, jaundice and rectal bleeding. . ; ; Genitourinary: Negative for dysuria, flank pain and hematuria. ; ; Musculoskeletal: Negative for back pain and neck pain. Negative for swelling and trauma.; ; Skin: Negative for pruritus, rash, abrasions, blisters, bruising and skin lesion.; ; Neuro: Negative for headache, lightheadedness and neck stiffness. Negative for weakness, altered level of consciousness, altered mental status, extremity weakness, paresthesias, involuntary movement, seizure and syncope.       Physical Exam Updated Vital Signs BP 120/73 (BP Location: Left Arm)   Pulse 78   Temp 99.2 F (37.3 C) (Oral)   Resp 18   Ht 5\' 2"  (1.575 m)   Wt 92 lb (41.7 kg)   LMP 12/17/2016 Comment: negative pregnancy test today 01/14/2017  SpO2 100%   BMI 16.83 kg/m   Physical Exam 2155: Physical examination:  Nursing notes reviewed; Vital signs and O2 SAT reviewed;  Constitutional: Well developed, Well nourished, Well hydrated, Crying out to staff.; Head:   Normocephalic, atraumatic; Eyes: EOMI, PERRL, No scleral icterus; ENMT: Mouth and pharynx normal, Mucous membranes moist; Neck: Supple, Full range of motion, No lymphadenopathy; Cardiovascular: Regular rate and rhythm, No gallop; Respiratory: Breath sounds clear & equal bilaterally, No wheezes.  Speaking full sentences with ease, Normal respiratory effort/excursion; Chest: Nontender, Movement normal; Abdomen: Soft, +diffuse tenderness to palp. Nondistended, Normal bowel sounds; Genitourinary: No CVA tenderness; Extremities: Pulses normal, No tenderness, No edema, No calf edema or asymmetry.; Neuro: AA&Ox3, Major CN grossly intact.  Speech clear. No gross focal motor or sensory deficits in extremities.; Skin: Color normal, Warm, Dry.   ED Treatments / Results  Labs (all labs ordered are listed, but only abnormal results are displayed)   EKG  EKG  Interpretation None       Radiology   Procedures Procedures (including critical care time)  Medications Ordered in ED Medications  cefTRIAXone (ROCEPHIN) 1 g in dextrose 5 % 50 mL IVPB (not administered)  ondansetron (ZOFRAN-ODT) disintegrating tablet 4 mg (4 mg Oral Given 01/14/17 1841)  sodium chloride 0.9 % bolus 1,000 mL (1,000 mLs Intravenous New Bag/Given 01/14/17 2217)  metoCLOPramide (REGLAN) injection 10 mg (10 mg Intravenous Given 01/14/17 2217)  diphenhydrAMINE (BENADRYL) injection 50 mg (50 mg Intravenous Given 01/14/17 2217)  famotidine (PEPCID) IVPB 20 mg premix (0 mg Intravenous Stopped 01/14/17 2314)  LORazepam (ATIVAN) injection 1 mg (1 mg Intravenous Given 01/14/17 2340)     Initial Impression / Assessment and Plan / ED Course  I have reviewed the triage vital signs and the nursing notes.  Pertinent labs & imaging results that were available during my care of the patient were reviewed by me and considered in my medical decision making (see chart for details).  MDM Reviewed: previous chart, nursing note and vitals Reviewed  previous: labs and CT scan Interpretation: labs and x-ray   Results for orders placed or performed during the hospital encounter of 01/14/17  Lipase, blood  Result Value Ref Range   Lipase <10 (L) 11 - 51 U/L  Comprehensive metabolic panel  Result Value Ref Range   Sodium 134 (L) 135 - 145 mmol/L   Potassium 3.9 3.5 - 5.1 mmol/L   Chloride 98 (L) 101 - 111 mmol/L   CO2 23 22 - 32 mmol/L   Glucose, Bld 70 65 - 99 mg/dL   BUN 31 (H) 6 - 20 mg/dL   Creatinine, Ser 1.61 0.44 - 1.00 mg/dL   Calcium 9.2 8.9 - 09.6 mg/dL   Total Protein 7.8 6.5 - 8.1 g/dL   Albumin 4.6 3.5 - 5.0 g/dL   AST 12 (L) 15 - 41 U/L   ALT 9 (L) 14 - 54 U/L   Alkaline Phosphatase 47 38 - 126 U/L   Total Bilirubin 1.5 (H) 0.3 - 1.2 mg/dL   GFR calc non Af Amer >60 >60 mL/min   GFR calc Af Amer >60 >60 mL/min   Anion gap 13 5 - 15  CBC  Result Value Ref Range   WBC 11.2 (H) 4.0 - 10.5 K/uL   RBC 4.40 3.87 - 5.11 MIL/uL   Hemoglobin 13.4 12.0 - 15.0 g/dL   HCT 04.5 40.9 - 81.1 %   MCV 87.5 78.0 - 100.0 fL   MCH 30.5 26.0 - 34.0 pg   MCHC 34.8 30.0 - 36.0 g/dL   RDW 91.4 78.2 - 95.6 %   Platelets 321 150 - 400 K/uL  Urinalysis, Routine w reflex microscopic  Result Value Ref Range   Color, Urine AMBER (A) YELLOW   APPearance CLOUDY (A) CLEAR   Specific Gravity, Urine 1.026 1.005 - 1.030   pH 5.0 5.0 - 8.0   Glucose, UA NEGATIVE NEGATIVE mg/dL   Hgb urine dipstick SMALL (A) NEGATIVE   Bilirubin Urine NEGATIVE NEGATIVE   Ketones, ur 80 (A) NEGATIVE mg/dL   Protein, ur 213 (A) NEGATIVE mg/dL   Nitrite NEGATIVE NEGATIVE   Leukocytes, UA TRACE (A) NEGATIVE   RBC / HPF 0-5 0 - 5 RBC/hpf   WBC, UA 6-30 0 - 5 WBC/hpf   Bacteria, UA MANY (A) NONE SEEN   Mucous PRESENT   Pregnancy, urine  Result Value Ref Range   Preg Test, Ur NEGATIVE NEGATIVE    Dg Abd  Acute W/chest Result Date: 01/14/2017 CLINICAL DATA:  Right-sided abdominal pain and dyspnea. EXAM: DG ABDOMEN ACUTE W/ 1V CHEST COMPARISON:  04/17/2015  FINDINGS: Stool and air throughout the colon. No evidence of bowel obstruction or perforation. No biliary or urinary calculi. Upright view of the chest is unchanged from 04/17/2015, with no evidence of acute cardiopulmonary abnormality. IMPRESSION: Negative abdominal radiographs.  No acute cardiopulmonary disease. Electronically Signed   By: Ellery Plunk M.D.   On: 01/14/2017 23:47    0020: Requested "more pain meds" multiple times. Pt tx for pain, but not with narcotics, as they are not indicated for gastroparesis; pt aware. Pt has tol PO well while in the ED without N/V.  No stooling while in the ED.  Feels better and wants to go home now. Will tx for UTI. Dx and testing d/w pt and family.  Questions answered.  Verb understanding, agreeable to d/c home with outpt f/u.        Final Clinical Impressions(s) / ED Diagnoses   Final diagnoses:  None    New Prescriptions New Prescriptions   No medications on file     Samuel Jester, DO 01/17/17 1305

## 2017-01-15 NOTE — ED Notes (Signed)
Pt given diet ginger ale to drink per pt's request and to complete the PO challenge. Pt has no nausea at this time but states that it usually happens 1 hour after PO

## 2017-01-15 NOTE — ED Notes (Signed)
ED Provider at bedside. 

## 2017-01-15 NOTE — Discharge Instructions (Signed)
Take the prescriptions as directed.  Increase your fluid intake (ie:  Gatoraide) for the next few days.  Eat a bland diet and advance to your regular diet slowly as you can tolerate it.  Call your regular medical doctor today to schedule a follow up appointment in the next 2 days.  Return to the Emergency Department immediately sooner if worsening.  °

## 2017-07-04 ENCOUNTER — Encounter: Payer: Self-pay | Admitting: *Deleted

## 2018-05-18 ENCOUNTER — Emergency Department (HOSPITAL_COMMUNITY): Payer: Medicaid Other

## 2018-05-18 ENCOUNTER — Emergency Department (HOSPITAL_COMMUNITY)
Admission: EM | Admit: 2018-05-18 | Discharge: 2018-05-18 | Disposition: A | Discharge status: Home / Self Care | Payer: Medicaid Other | Attending: Emergency Medicine | Admitting: Emergency Medicine

## 2018-05-18 ENCOUNTER — Other Ambulatory Visit: Payer: Self-pay

## 2018-05-18 ENCOUNTER — Encounter (HOSPITAL_COMMUNITY): Payer: Self-pay | Admitting: Emergency Medicine

## 2018-05-18 DIAGNOSIS — Y999 Unspecified external cause status: Secondary | ICD-10-CM | POA: Insufficient documentation

## 2018-05-18 DIAGNOSIS — S8002XA Contusion of left knee, initial encounter: Secondary | ICD-10-CM | POA: Insufficient documentation

## 2018-05-18 DIAGNOSIS — Y9301 Activity, walking, marching and hiking: Secondary | ICD-10-CM | POA: Diagnosis not present

## 2018-05-18 DIAGNOSIS — Y929 Unspecified place or not applicable: Secondary | ICD-10-CM | POA: Diagnosis not present

## 2018-05-18 DIAGNOSIS — W19XXXA Unspecified fall, initial encounter: Secondary | ICD-10-CM | POA: Diagnosis not present

## 2018-05-18 DIAGNOSIS — S8992XA Unspecified injury of left lower leg, initial encounter: Secondary | ICD-10-CM | POA: Diagnosis present

## 2018-05-18 MED ORDER — ONDANSETRON HCL 4 MG PO TABS
4.0000 mg | ORAL_TABLET | Freq: Once | ORAL | Status: AC
  Administered 2018-05-18: 4 mg via ORAL
  Filled 2018-05-18: qty 1

## 2018-05-18 MED ORDER — KETOROLAC TROMETHAMINE 10 MG PO TABS
10.0000 mg | ORAL_TABLET | Freq: Once | ORAL | Status: AC
  Administered 2018-05-18: 10 mg via ORAL
  Filled 2018-05-18: qty 1

## 2018-05-18 MED ORDER — HYDROCODONE-ACETAMINOPHEN 5-325 MG PO TABS
2.0000 | ORAL_TABLET | Freq: Once | ORAL | Status: AC
  Administered 2018-05-18: 2 via ORAL
  Filled 2018-05-18: qty 2

## 2018-05-18 MED ORDER — HYDROCODONE-ACETAMINOPHEN 5-325 MG PO TABS: 1.0000 | ORAL_TABLET | ORAL | 0 refills | Status: DC | PRN

## 2018-05-18 NOTE — Discharge Instructions (Signed)
Your vital signs have been reviewed.  The x-ray of your knee shows your hardware to be in place.  No new fracture, dislocation, or fluid in the joint.  Your examination is negative for any hot or septic joint.  There are no acute neurologic or vascular deficits appreciated.  I suspect that you have a contusion to the knee that you have already had extensive surgery on.  Please contact Dr. Scharlene GlossHall's office for follow-up and for assistance with any pain needed pain management.

## 2018-05-18 NOTE — ED Triage Notes (Addendum)
PT c/o left knee pain worsening after a fall x2 weeks ago. PT said she was seen at her PCP office today and was told to come to ED for eval due to past surgical hx of same knee. PT states she has also been experiencing more panic attacks and increased anxiety. PT shaking in triage and feels some better after given verbal relaxation techniques.

## 2018-05-18 NOTE — ED Notes (Addendum)
Pt states she thought the Dr was going to give her a shot for this.  Also states "I should have just went to my Dr if this was all im going to get, she would have gave me a damn shot." edp notified

## 2018-05-18 NOTE — ED Provider Notes (Signed)
[facility] EMERGENCY DEPARTMENT Provider Note   CSN: [id number] Arrival date & time: 05/18/18  1509     History   Chief Complaint Chief Complaint  Patient presents with  . Knee Injury    HPI Kristin [name] is a 37 y.o. female.  Patient is a 37 year old female who presents to the emergency department with a complaint of left knee pain.  The patient states that nearly 2 weeks ago she sustained a fall and injured her left knee.  It is of note that the patient sustained a crush injury approximately 3 years ago at which time she had to have cadaver bone placed, plates rods and screws placed in the left knee area and lower extremity.  The patient states that she had pain during the time of her fall, and it seems to be getting progressively worse.  She says that she was seen by providers at Dr. Scharlene Gloss office, and they wanted her to come immediately to the emergency department for x-rays and evaluation.  The patient states she had some swelling earlier, but the swelling has begun to go down.  The pain however has continued to increase.  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  No other problems recently other than the fall 2 weeks ago in the recent driving of the vehicle.  Is been no fever or chills.  No evidence of hot joints noted by the family.     Past Medical History:  Diagnosis Date  . Asthma   . Bipolar affective disorder (HCC)   . Chronic abdominal pain   . Chronic pain   . Diabetes mellitus type 1 (HCC)    diagnosed at 37yo; hospitalized multiple times for "DKA"  . GERD (gastroesophageal reflux disease)   . IBS (irritable bowel syndrome)   . Nausea and vomiting   . 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 Substance abuse [facility])     Patient Active Problem List   Diagnosis Date Noted  . 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 UTI (urinary tract infection) 09/18/2016  . DKA (diabetic ketoacidosis) (HCC) 09/17/2016  . Uncontrolled diabetes mellitus (HCC) 09/17/2016  . Chronic pain syndrome 09/17/2016  . Substance abuse (HCC) 09/17/2016  . Bipolar disorder (HCC) 09/17/2016  . Colitis, acute 09/16/2014  . Abdominal pain   . Hematemesis 05/05/2014  . Intractable vomiting 04/02/2014  . IBS (irritable bowel syndrome) 04/02/2014  . Tobacco abuse 04/02/2014  . Diabetes mellitus type I (HCC) 04/02/2014  . Hematochezia 08/23/2012  . Nausea & vomiting 08/23/2012    Past Surgical History:  Procedure Laterality Date  . APPENDECTOMY    . BIOPSY N/A 05/31/2014   GASTRIC BX NOT ESOPHAGEAL  . COLONOSCOPY  08/24/2012   NL COLON Bx  . COLONOSCOPY WITH PROPOFOL N/A 05/31/2014   NL COLON Bx  . ESOPHAGOGASTRODUODENOSCOPY  08/24/2012   NL DUO Bx  . ESOPHAGOGASTRODUODENOSCOPY (EGD) WITH PROPOFOL N/A 05/31/2014   GASTRITIS, NL DUO Bx  . KNEE SURGERY     left  . TUBAL LIGATION    . WRIST SURGERY Right      OB History    Gravida  2   Para  2   Term  2   Preterm      AB      Living  2     SAB      TAB      Ectopic      Multiple      Live Births               Home Medications    Prior to Admission medications   Medication Sig Start Date End Date Taking? Authorizing Provider  busPIRone (BUSPAR) 10 MG tablet Take 10 mg by mouth 2 (two) times daily.    [provider]  cephALEXin (KEFLEX) 500 MG capsule Take 1 capsule (500 mg total) by mouth 4 (four) times daily. 01/15/17   Samuel Jester, DO  clonazePAM (KLONOPIN) 1 MG tablet Take 1 mg by mouth 3 (three) times daily as needed for anxiety.    [provider]  DULoxetine (CYMBALTA) 60 MG capsule Take 60 mg by mouth daily.  08/26/16   [provider]  feeding supplement (BOOST / RESOURCE BREEZE) LIQD Take 1 Container by mouth 3 (three) times daily between meals. 09/18/16   Dhungel, Theda Belfast, MD    Insulin Glargine (BASAGLAR KWIKPEN) 100 UNIT/ML SOPN Inject 8-12 Units into the skin 2 (two) times daily. 12u in the morning and 8u at bedtime    [provider]  insulin lispro (HUMALOG KWIKPEN) 100 UNIT/ML KiwkPen Inject 1-30 Units into the skin 3 (three) times daily. Based on sliding scale, 1u for every 10g of carbs    [provider]  lisinopril (PRINIVIL,ZESTRIL) 2.5 MG tablet Take 2.5 mg by mouth daily. 08/26/16   [provider]  metoCLOPramide (REGLAN) 10 MG tablet Take 1 tablet (10 mg total) by mouth every 6 (six) hours as needed for nausea (or headache). 01/15/17   Samuel Jester, DO  ondansetron (ZOFRAN ODT) 4 MG disintegrating tablet Take 1 tablet (4 mg total) by mouth every 8 (eight) hours as needed for nausea or vomiting. 01/15/17   Samuel Jester, DO  oxyCODONE-acetaminophen (PERCOCET) 10-325 MG tablet Take 1 tablet by mouth every 6 (six) hours as needed for pain.    [provider]  pantoprazole (PROTONIX) 40 MG tablet Take 40 mg by mouth daily.    [provider]  QUEtiapine (SEROQUEL) 300 MG tablet Take 300 mg by mouth at bedtime.    [provider]    Family History Family History  Problem Relation Age of Onset  . Diabetes Other   . Stroke Other   . Colon cancer Neg Hx     Social History Social History   Tobacco Use  . Smoking status: Current Every Day Smoker    Packs/day: 1.50    Years: 12.00    Pack years: 18.00    Types: Cigarettes  . Smokeless tobacco: Never Used  Substance Use Topics  . Alcohol use: No  . Drug use: Yes    Types: Marijuana    Comment: last use 3 days     Allergies   Sulfa antibiotics   Review of Systems Review of Systems  Constitutional: Negative for activity change.       All ROS Neg except as noted in HPI  HENT: Negative for nosebleeds.   Eyes: Negative for photophobia and discharge.  Respiratory: Negative for cough, shortness of breath and wheezing.   Cardiovascular:  Negative for chest pain and palpitations.  Gastrointestinal: Negative for abdominal pain and blood in stool.  Genitourinary: Negative for dysuria, frequency and hematuria.  Musculoskeletal: Positive for arthralgias. Negative for back pain and neck pain.       Knee pain  Skin: Negative.   Neurological: Negative for dizziness, seizures and speech difficulty.  Psychiatric/Behavioral: Negative for confusion and hallucinations.     Physical Exam Updated Vital Signs BP 137/90   Pulse (!) 110   Temp 98.3 F (36.8 C) (Oral)   Resp 20   LMP 05/15/2018   SpO2 97%   Physical Exam  Constitutional: She is oriented to person, place, and time. She appears well-developed and well-nourished.  Non-toxic appearance.  HENT:  Head: Normocephalic.  Right Ear: Tympanic membrane and external ear normal.  Left Ear: Tympanic membrane and external ear normal.  Eyes: Pupils are equal, round, and reactive to light. EOM and lids are normal.  Neck: Normal range of motion. Neck supple. Carotid bruit is not present.  Cardiovascular: Regular rhythm, normal heart sounds, intact distal pulses and normal pulses. Tachycardia present.  Pulmonary/Chest: Breath sounds normal. No respiratory distress.  Abdominal: Soft. Bowel sounds are normal. There is no tenderness. There is no guarding.  Musculoskeletal: Normal range of motion.  Good range of motion of the left hip.  Well-healed surgical scar noted of the left knee area.  There is pain to palpation and attempted movement of the left knee.  No hot joint appreciated.  There is also seen evidence of the surgical hardware through the skin of the left knee and lower extremity.  There is no deformity of the quadricep area.  The patella is in the midline.  There is no effusion appreciated.  There is no posterior mass noted.  There is no palpable deformity or hematoma of the anterior tibial area.  The Achilles tendon is intact.  The dorsalis pedis pulse is 2+, the posterior tibial  pulse is 2+.  The capillary refill is less than 2 seconds.  Lymphadenopathy:       Head (right side): No submandibular adenopathy present.       Head (left side): No submandibular adenopathy present.    She has no cervical adenopathy.  Neurological: She is alert and oriented to person, place, and time. She has normal strength. No cranial nerve deficit or sensory deficit.  There are no sensory deficits appreciated of the lower extremity.  The patient does not cooperate for full motor examination of the left lower extremity, but the patient can move her ankle and wiggle her toes.  Skin: Skin is warm and dry.  Psychiatric: She has a normal mood and affect. Her speech is normal.  Nursing note and vitals reviewed.    ED Treatments / Results  Labs (all labs ordered are listed, but only abnormal results are displayed) Labs Reviewed - No data to display  EKG None  Radiology Dg Knee Complete 4 Views Left  Result Date: 05/18/2018 CLINICAL DATA:  37 year old female with left knee pain. Prior proximal tibia ORIF. EXAM: LEFT KNEE - COMPLETE 4+ VIEW COMPARISON:  Left knee series 12/13/2016 and earlier. FINDINGS: Stable proximal left tibia lateral approach ORIF hardware. No joint effusion. The patella is intact. Joint spaces and alignment are stable. No acute osseous abnormality identified. IMPRESSION: Stable postoperative appearance of the left knee. No acute osseous abnormality identified. Electronically Signed   By: Odessa FlemingH  Hall M.D.   On: 05/18/2018 15:52    Procedures Procedures (including critical care time)  Medications Ordered in ED Medications  HYDROcodone-acetaminophen (NORCO/VICODIN) 5-325 MG per tablet 2 tablet (2 tablets Oral Given 05/18/18 1744)  ketorolac (TORADOL) tablet 10 mg (10 mg Oral Given 05/18/18 1744)  ondansetron (ZOFRAN) tablet 4 mg (4 mg Oral Given 05/18/18 1744)  Initial Impression / Assessment and Plan / ED Course  I have reviewed the triage vital signs and the nursing  notes.  Pertinent labs & imaging results that were available during my care of the patient were reviewed by me and considered in my medical decision making (see chart for details).     Vital signs reviewed.  Pulse oximetry is 97% on room air.  Final Clinical Impressions(s) / ED Diagnoses MDM Patient sustained a crush injury to the left knee approximately 3 years ago.  Patient had a fall and injured the knee about 2 weeks ago she was not seen by anyone at that time.  She also drove a vehicle and she had not been driving and had not been putting a lot of pressure on her leg over the 2 weeks..  Patient was seen by Dr. Scharlene Gloss office, and advised to come to the emergency department for evaluation.  The x-ray of the knee is negative for fracture, dislocation, or effusion.  The hardware is in place.  There are no gross neurovascular deficits appreciated on the examination.  There is no evidence for hot or septic joint.  Call placed to Dr. Scharlene Gloss office to notify them of the findings.  Patient given a few tablets of hydrocodone for tonight and tomorrow.  I have asked the patient to follow-up with Dr. Scharlene Gloss office for any additional pain management or additional studies.    Final diagnoses:  Contusion of left knee, initial encounter    ED Discharge Orders        Ordered    HYDROcodone-acetaminophen (NORCO/VICODIN) 5-325 MG tablet  Every 4 hours PRN     05/18/18 1804       Ivery Quale, PA-C 05/18/18 1806    Donnetta Hutching, MD 05/19/18 [phone]

## 2021-05-18 ENCOUNTER — Other Ambulatory Visit (HOSPITAL_COMMUNITY): Payer: Self-pay | Admitting: Student

## 2021-05-18 DIAGNOSIS — M25531 Pain in right wrist: Secondary | ICD-10-CM

## 2021-05-18 DIAGNOSIS — M79641 Pain in right hand: Secondary | ICD-10-CM

## 2021-05-18 DIAGNOSIS — M79644 Pain in right finger(s): Secondary | ICD-10-CM

## 2021-12-04 ENCOUNTER — Ambulatory Visit: Payer: Self-pay | Admitting: Orthopaedic Surgery

## 2021-12-11 ENCOUNTER — Ambulatory Visit: Payer: Self-pay | Admitting: Orthopaedic Surgery

## 2022-03-12 ENCOUNTER — Ambulatory Visit: Payer: Self-pay | Admitting: Orthopaedic Surgery

## 2022-03-14 ENCOUNTER — Ambulatory Visit (INDEPENDENT_AMBULATORY_CARE_PROVIDER_SITE_OTHER): Payer: Medicaid Other | Admitting: Orthopaedic Surgery

## 2022-03-14 ENCOUNTER — Encounter: Payer: Self-pay | Admitting: Orthopaedic Surgery

## 2022-03-14 ENCOUNTER — Ambulatory Visit (INDEPENDENT_AMBULATORY_CARE_PROVIDER_SITE_OTHER): Payer: Medicaid Other

## 2022-03-14 VITALS — BP 124/84 | HR 98 | Ht 62.0 in | Wt 121.2 lb

## 2022-03-14 DIAGNOSIS — G8929 Other chronic pain: Secondary | ICD-10-CM | POA: Diagnosis not present

## 2022-03-14 DIAGNOSIS — M25562 Pain in left knee: Secondary | ICD-10-CM | POA: Diagnosis not present

## 2022-03-14 NOTE — Progress Notes (Signed)
? ?Subjective:  ? ? Patient ID: Kristin [name], female    DOB: Jan 17, 1981, 41 y.o.   MRN: [id number] ? ?HPI ?She had a tibial plateau fracture with ORIF done several years ago in [place].  She has plate and screws laterally in the left knee tibial area.  She has noticed prominence of one or two of the screw head now and it hurts.  She has no trauma, no redness, no wound.  She is wondering if something can be done. ? ? ?Review of Systems  ?Constitutional:  Positive for activity change.  ?Respiratory:  Positive for shortness of breath.   ?Musculoskeletal:  Positive for arthralgias, gait problem and myalgias.  ?All other systems reviewed and are negative. ?For Review of Systems, all other systems reviewed and are negative. ? ?The following is a summary of the past history medically, past history surgically, known current medicines, social history and family history.  This information is gathered electronically by the computer from prior information and documentation.  I review this each visit and have found including this information at this point in the chart is beneficial and informative.  ? ?Past Medical History:  ?Diagnosis Date  ? Asthma   ? Bipolar affective disorder (HCC)   ? Chronic abdominal pain   ? Chronic pain   ? Diabetes mellitus type 1 (HCC)   ? diagnosed at 41yo; hospitalized multiple times for "DKA"  ? GERD (gastroesophageal reflux disease)   ? IBS (irritable bowel syndrome)   ? Nausea and vomiting   ? Seizure (HCC)   ? ?hypoglycemic seizures; last seizure was about 10/17  ? Substance abuse (HCC)   ? ? ?Past Surgical History:  ?Procedure Laterality Date  ? APPENDECTOMY    ? BIOPSY N/A 05/31/2014  ? GASTRIC BX NOT ESOPHAGEAL  ? COLONOSCOPY  08/24/2012  ? NL COLON Bx  ? COLONOSCOPY WITH PROPOFOL N/A 05/31/2014  ? NL COLON Bx  ? ESOPHAGOGASTRODUODENOSCOPY  08/24/2012  ? NL DUO Bx  ? ESOPHAGOGASTRODUODENOSCOPY (EGD) WITH PROPOFOL N/A 05/31/2014  ? GASTRITIS, NL DUO Bx  ? KNEE SURGERY    ? left  ? TUBAL LIGATION     ? WRIST SURGERY Right   ? ? ?Current Outpatient Medications on File Prior to Visit  ?Medication Sig Dispense Refill  ? atorvastatin (LIPITOR) 40 MG tablet Take 40 mg by mouth at bedtime.    ? DULoxetine (CYMBALTA) 60 MG capsule Take 60 mg by mouth daily.   11  ? feeding supplement (BOOST / RESOURCE BREEZE) LIQD Take 1 Container by mouth 3 (three) times daily between meals. 90 Container 0  ? ferrous sulfate 325 (65 FE) MG tablet Take 325 mg by mouth daily.    ? insulin aspart (NOVOLOG) 100 UNIT/ML injection Inject 1-13 Units into the skin as directed. Sliding scale    ? insulin glargine (LANTUS) 100 UNIT/ML injection Inject 12 Units into the skin every morning.    ? lisinopril (PRINIVIL,ZESTRIL) 2.5 MG tablet Take 2.5 mg by mouth daily.  11  ? mirtazapine (REMERON) 15 MG tablet Take 15 mg by mouth at bedtime.    ? pantoprazole (PROTONIX) 40 MG tablet Take 40 mg by mouth daily.    ? QUEtiapine (SEROQUEL) 300 MG tablet Take 300 mg by mouth at bedtime.    ? rOPINIRole (REQUIP) 2 MG tablet Take 2 mg by mouth at bedtime.    ? ?No current facility-administered medications on file prior to visit.  ? ? ?Social History  ? ?Socioeconomic History  ?  Marital status: Legally Separated  ?  Spouse name: Not on file  ? Number of children: 2  ? Years of education: Not on file  ? Highest education level: Not on file  ?Occupational History  ? Occupation: trying to qualify for disability  ?Tobacco Use  ? Smoking status: Every Day  ?  Packs/day: 1.50  ?  Years: 12.00  ?  Pack years: 18.00  ?  Types: Cigarettes  ? Smokeless tobacco: Never  ?Vaping Use  ? Vaping Use: Never used  ?Substance and Sexual Activity  ? Alcohol use: No  ? Drug use: Yes  ?  Types: Marijuana  ?  Comment: last use 3 days  ? Sexual activity: Yes  ?  Birth control/protection: None  ?Other Topics Concern  ? Not on file  ?Social History Narrative  ? Not on file  ? ?Social Determinants of Health  ? ?Financial Resource Strain: Not on file  ?Food Insecurity: Not on file   ?Transportation Needs: Not on file  ?Physical Activity: Not on file  ?Stress: Not on file  ?Social Connections: Not on file  ?Intimate Partner Violence: Not on file  ? ? ?Family History  ?Problem Relation Age of Onset  ? Diabetes Other   ? Stroke Other   ? Colon cancer Neg Hx   ? ? ?BP 124/84   Pulse 98   Ht 5\' 2"  (1.575 m)   Wt 121 lb 3.2 oz (55 kg)   BMI 22.17 kg/m?  ? ?Body mass index is 22.17 kg/m?. ? ?   ?Objective:  ? Physical Exam ?Vitals and nursing note reviewed. Exam conducted with a chaperone present.  ?Constitutional:   ?   Appearance: She is well-developed.  ?HENT:  ?   Head: Normocephalic and atraumatic.  ?Eyes:  ?   Conjunctiva/sclera: Conjunctivae normal.  ?   Pupils: Pupils are equal, round, and reactive to light.  ?Cardiovascular:  ?   Rate and Rhythm: Normal rate and regular rhythm.  ?Pulmonary:  ?   Effort: Pulmonary effort is normal.  ?Abdominal:  ?   Palpations: Abdomen is soft.  ?Musculoskeletal:  ?   Cervical back: Normal range of motion and neck supple.  ?     Legs: ? ?Skin: ?   General: Skin is warm and dry.  ?Neurological:  ?   Mental Status: She is alert and oriented to person, place, and time.  ?   Cranial Nerves: No cranial nerve deficit.  ?   Motor: No abnormal muscle tone.  ?   Coordination: Coordination normal.  ?   Deep Tendon Reflexes: Reflexes are normal and symmetric. Reflexes normal.  ?Psychiatric:     ?   Behavior: Behavior normal.     ?   Thought Content: Thought content normal.     ?   Judgment: Judgment normal.  ? ?X-rays were done of the left knee, reported separately. ? ? ? ?   ?Assessment & Plan:  ? ?Encounter Diagnosis  ?Name Primary?  ? Chronic pain of left knee Yes  ? ?I have shown her the X-rays and one or two screw heads are prominent and may be backing out. ? ?I will have her see Dr. or Dr. Romeo Apple to discuss possible removal of screw(s) and or entire fixation device. ? ?She is agreeable to this. ? ?Call if any problem. ? ?Precautions  discussed. ? ?Electronically Signed ?Dallas Schimke, MD ?5/4/202311:44 AM ? ?

## 2022-03-20 ENCOUNTER — Ambulatory Visit: Payer: Medicaid Other | Admitting: Orthopedic Surgery

## 2023-12-29 ENCOUNTER — Other Ambulatory Visit (HOSPITAL_COMMUNITY): Payer: Self-pay | Admitting: Nurse Practitioner

## 2023-12-29 DIAGNOSIS — Z1231 Encounter for screening mammogram for malignant neoplasm of breast: Secondary | ICD-10-CM

## 2024-07-06 ENCOUNTER — Other Ambulatory Visit (HOSPITAL_COMMUNITY): Payer: Self-pay | Admitting: Nurse Practitioner

## 2024-07-06 DIAGNOSIS — R229 Localized swelling, mass and lump, unspecified: Secondary | ICD-10-CM

## 2024-07-23 ENCOUNTER — Ambulatory Visit (HOSPITAL_COMMUNITY)
Admission: RE | Admit: 2024-07-23 | Discharge: 2024-07-23 | Disposition: A | Discharge status: Home / Self Care | Payer: MEDICAID | Source: Ambulatory Visit | Attending: Nurse Practitioner | Admitting: Nurse Practitioner

## 2024-07-23 DIAGNOSIS — R229 Localized swelling, mass and lump, unspecified: Secondary | ICD-10-CM | POA: Insufficient documentation
